# Patient Record
Sex: Male | Born: 1965 | Race: White | Hispanic: No | Marital: Married | Smoking: Light tobacco smoker
Health system: Southern US, Community
[De-identification: ages and names within clinical notes are randomized; demographics above are authoritative.]

## PROBLEM LIST (undated history)

## (undated) DIAGNOSIS — G43909 Migraine, unspecified, not intractable, without status migrainosus: Secondary | ICD-10-CM

## (undated) DIAGNOSIS — H3320 Serous retinal detachment, unspecified eye: Secondary | ICD-10-CM

## (undated) DIAGNOSIS — S0230XA Fracture of orbital floor, unspecified side, initial encounter for closed fracture: Secondary | ICD-10-CM

## (undated) DIAGNOSIS — S060X0A Concussion without loss of consciousness, initial encounter: Secondary | ICD-10-CM

## (undated) DIAGNOSIS — F0781 Postconcussional syndrome: Secondary | ICD-10-CM

## (undated) DIAGNOSIS — F411 Generalized anxiety disorder: Secondary | ICD-10-CM

## (undated) HISTORY — DX: Migraine, unspecified, not intractable, without status migrainosus: G43.909

## (undated) HISTORY — DX: Postconcussional syndrome: F07.81

## (undated) HISTORY — DX: Serous retinal detachment, unspecified eye: H33.20

## (undated) HISTORY — DX: Concussion without loss of consciousness, initial encounter: S06.0X0A

## (undated) HISTORY — PX: KNEE SURGERY: SHX244

## (undated) HISTORY — PX: BACK SURGERY: SHX140

## (undated) HISTORY — DX: Fracture of orbital floor, unspecified side, initial encounter for closed fracture: S02.30XA

## (undated) HISTORY — DX: Generalized anxiety disorder: F41.1

---

## 2004-09-12 ENCOUNTER — Emergency Department: Payer: Self-pay | Admitting: Emergency Medicine

## 2005-03-06 ENCOUNTER — Emergency Department: Payer: Self-pay | Admitting: Internal Medicine

## 2005-07-03 ENCOUNTER — Emergency Department: Payer: Self-pay | Admitting: Emergency Medicine

## 2010-08-13 ENCOUNTER — Emergency Department: Payer: Self-pay | Admitting: Unknown Physician Specialty

## 2011-09-24 ENCOUNTER — Telehealth: Payer: Self-pay | Admitting: *Deleted

## 2011-09-24 NOTE — Telephone Encounter (Signed)
I completely agree. Fever, stiff neck, malaise can be signs of bacterial meningitis. He needs to be evaluated in the ER ASAP. He may have been partially treated with zpac. Untreated meningitis can lead to loss of limb and death. I will look at schedule with Zella Ball to get him in as soon as possible as new patient, but he MUST be seen in the ED for current symptoms.

## 2011-09-24 NOTE — Telephone Encounter (Signed)
Yesterday PM I spoke with pt's wife (who is a patient of Dr Dan Humphreys) - She wanted to make new patient apt ASAP (yesterday) with Dr Dan Humphreys. I informed her that new patient apts were 2 weeks out. Wife said it was urgent that he get in with MD due to symptoms.   Pt was being treated for a sinus infection by his PCP and completed zpak 3 days ago. Wife says that the PCP keeps telling them to "give the zpak more time".  He also has developed nausea, vomiting and diarrhea with some small amounts of blood during BM's. Continues to have low grade fever, STIFF NECK, chills and body aches even with OTC fever reducers. At night pt c/o severe headache/pains and is unable to lay flat.    I stressed she take him to the ER due to these very serious symptoms that have persisted. She says he has refused to go to the ER. Patient almost passed out a day or so ago while driving. Again I stressed she take him to the ER. She understands but then asked if she should call [facility] b/c "he was a patient there many years ago". I AGAIN stressed to her he has serious symptoms and should be evaluated asap at the ER.  I advised wife that we would see him as a new patient when there was an opening but he must go to the ER ASAP, she voiced understanding.

## 2011-09-24 NOTE — Telephone Encounter (Signed)
Left message for pt wife to call office that we set up new patient appointment also left message that dr walker said her husband needed to go to the er

## 2012-03-08 ENCOUNTER — Emergency Department: Payer: Self-pay | Admitting: Emergency Medicine

## 2012-03-08 LAB — CBC
HCT: 44.7 % (ref 40.0–52.0)
HGB: 15.3 g/dL (ref 13.0–18.0)
MCH: 34.2 pg — ABNORMAL HIGH (ref 26.0–34.0)
MCHC: 34.1 g/dL (ref 32.0–36.0)
RBC: 4.46 10*6/uL (ref 4.40–5.90)

## 2012-03-08 LAB — COMPREHENSIVE METABOLIC PANEL
Alkaline Phosphatase: 96 U/L (ref 50–136)
Anion Gap: 8 (ref 7–16)
Bilirubin,Total: 0.7 mg/dL (ref 0.2–1.0)
Chloride: 107 mmol/L (ref 98–107)
Creatinine: 1.02 mg/dL (ref 0.60–1.30)
EGFR (African American): >60
Glucose: 79 mg/dL (ref 65–99)
Osmolality: 278 (ref 275–301)
SGOT(AST): 27 U/L (ref 15–37)
SGPT (ALT): 37 U/L
Sodium: 140 mmol/L (ref 136–145)

## 2012-03-08 LAB — URINALYSIS, COMPLETE
Glucose,UR: NEGATIVE mg/dL (ref 0–75)
Hyaline Cast: 1
Ketone: NEGATIVE
Ph: 5 (ref 4.5–8.0)
Protein: NEGATIVE
Specific Gravity: 1.014 (ref 1.003–1.030)
WBC UR: 2 /HPF (ref 0–5)

## 2012-12-15 ENCOUNTER — Emergency Department (HOSPITAL_COMMUNITY)
Admission: EM | Admit: 2012-12-15 | Discharge: 2012-12-15 | Disposition: A | Discharge status: Home / Self Care | Payer: PRIVATE HEALTH INSURANCE | Attending: Emergency Medicine | Admitting: Emergency Medicine

## 2012-12-15 ENCOUNTER — Emergency Department (HOSPITAL_COMMUNITY): Payer: Self-pay

## 2012-12-15 ENCOUNTER — Encounter (HOSPITAL_COMMUNITY): Payer: Self-pay | Admitting: Emergency Medicine

## 2012-12-15 DIAGNOSIS — J40 Bronchitis, not specified as acute or chronic: Secondary | ICD-10-CM | POA: Insufficient documentation

## 2012-12-15 DIAGNOSIS — F17200 Nicotine dependence, unspecified, uncomplicated: Secondary | ICD-10-CM | POA: Insufficient documentation

## 2012-12-15 DIAGNOSIS — R059 Cough, unspecified: Secondary | ICD-10-CM | POA: Insufficient documentation

## 2012-12-15 DIAGNOSIS — R05 Cough: Secondary | ICD-10-CM | POA: Insufficient documentation

## 2012-12-15 MED ORDER — HYDROCOD POLST-CHLORPHEN POLST 10-8 MG/5ML PO LQCR: 5.0000 mL | Freq: Two times a day (BID) | ORAL | Status: DC | PRN

## 2012-12-15 MED ORDER — DEXAMETHASONE SODIUM PHOSPHATE 10 MG/ML IJ SOLN
10.0000 mg | Freq: Once | INTRAMUSCULAR | Status: AC
  Administered 2012-12-15: 10 mg via INTRAMUSCULAR
  Filled 2012-12-15: qty 1

## 2012-12-15 MED ORDER — ALBUTEROL SULFATE HFA 108 (90 BASE) MCG/ACT IN AERS
2.0000 | INHALATION_SPRAY | RESPIRATORY_TRACT | Status: DC | PRN
  Administered 2012-12-15: 2 via RESPIRATORY_TRACT
  Filled 2012-12-15: qty 6.7

## 2012-12-15 NOTE — ED Notes (Signed)
MD at bedside. 

## 2012-12-15 NOTE — ED Provider Notes (Signed)
History     CSN: [id number]  Arrival date & time 12/15/12  1359   First MD Initiated Contact with Patient 12/15/12 1449      Chief Complaint  Patient presents with  . Shortness of Breath  . URI  . Cough    (Consider location/radiation/quality/duration/timing/severity/associated sxs/prior treatment) HPI Pt with no known medical problems reports 2-3 days of cough, wheezing, SOB, sore throat, fever and general malaise. He has had other co-workers who have had similar symptoms. He is a smoker.   History reviewed. No pertinent past medical history.  History reviewed. No pertinent past surgical history.  History reviewed. No pertinent family history.  History  Substance Use Topics  . Smoking status: Current Every Day Smoker  . Smokeless tobacco: Not on file  . Alcohol Use: No      Review of Systems All other systems reviewed and are negative except as noted in HPI.   Allergies  Review of patient's allergies indicates no known allergies.  Home Medications  No current outpatient prescriptions on file.  BP 125/74  Pulse 95  Temp 97.9 F (36.6 C) (Oral)  Resp 16  SpO2 97%  Physical Exam  Nursing note and vitals reviewed. Constitutional: He is oriented to person, place, and time. He appears well-developed and well-nourished.  HENT:  Head: Normocephalic and atraumatic.  Eyes: EOM are normal. Pupils are equal, round, and reactive to light.  Neck: Normal range of motion. Neck supple.  Cardiovascular: Normal rate, normal heart sounds and intact distal pulses.   Pulmonary/Chest: Effort normal. He has wheezes.  Abdominal: Bowel sounds are normal. He exhibits no distension. There is no tenderness.  Musculoskeletal: Normal range of motion. He exhibits no edema and no tenderness.  Neurological: He is alert and oriented to person, place, and time. He has normal strength. No cranial nerve deficit or sensory deficit.  Skin: Skin is warm and dry. No rash noted.  Psychiatric:  He has a normal mood and affect.    ED Course  Procedures (including critical care time)   Labs Reviewed  RAPID STREP SCREEN   Dg Chest 2 View  12/15/2012  *RADIOLOGY REPORT*  Clinical Data: Wheezing and shortness of breath.  Chest pain.  CHEST - 2 VIEW  Comparison: None.  Findings: The heart size is normal.  Mild interstitial coarsening is likely chronic.  The lung volumes are low.  No focal airspace disease is evident.  IMPRESSION:  1.  Low lung volumes. 2.  No acute cardiopulmonary disease.   Original Report Authenticated By: Marin Roberts, M.D.      No diagnosis found.    MDM  Strep and CXR ordered in triage negative. Likely a viral bronchitis. Given albuterol, decadron. Rx for Tussionex.         Charles B. Bernette Mayers, MD 12/15/12 [phone]

## 2012-12-15 NOTE — ED Notes (Addendum)
Pt c/o sob since last night. Pt states "ive been sick since Monday." Pt states "My neck and back are really sore." Pt coughing up yellow mucus. Pt breathing loudly through mouth. No resp distress noted.

## 2012-12-15 NOTE — ED Notes (Signed)
Pt c/o sore throat, SOB, cough and URI x 3 days; pt sts increased SOB

## 2013-02-08 ENCOUNTER — Emergency Department: Payer: Self-pay | Admitting: Emergency Medicine

## 2013-02-08 LAB — COMPREHENSIVE METABOLIC PANEL
Albumin: 3.7 g/dL (ref 3.4–5.0)
Alkaline Phosphatase: 116 U/L (ref 50–136)
Anion Gap: 7 (ref 7–16)
Bilirubin,Total: 0.8 mg/dL (ref 0.2–1.0)
Calcium, Total: 8.4 mg/dL — ABNORMAL LOW (ref 8.5–10.1)
Co2: 24 mmol/L (ref 21–32)
EGFR (African American): >60
EGFR (Non-African Amer.): >60
Glucose: 106 mg/dL — ABNORMAL HIGH (ref 65–99)
Potassium: 4 mmol/L (ref 3.5–5.1)
SGOT(AST): 21 U/L (ref 15–37)
Sodium: 136 mmol/L (ref 136–145)
Total Protein: 7.3 g/dL (ref 6.4–8.2)

## 2013-02-08 LAB — CBC
HCT: 49.7 % (ref 40.0–52.0)
HGB: 17 g/dL (ref 13.0–18.0)
MCH: 33.6 pg (ref 26.0–34.0)
MCV: 98 fL (ref 80–100)
Platelet: 191 10*3/uL (ref 150–440)
RBC: 5.07 10*6/uL (ref 4.40–5.90)
RDW: 13.2 % (ref 11.5–14.5)

## 2013-02-08 LAB — URINALYSIS, COMPLETE
Blood: NEGATIVE
Glucose,UR: NEGATIVE mg/dL (ref 0–75)
Ketone: NEGATIVE
Leukocyte Esterase: NEGATIVE
Nitrite: NEGATIVE
Squamous Epithelial: NONE SEEN

## 2013-02-08 LAB — RAPID INFLUENZA A&B ANTIGENS

## 2013-02-08 LAB — TROPONIN I: Troponin-I: <0.02 ng/mL

## 2013-06-07 ENCOUNTER — Ambulatory Visit: Payer: PRIVATE HEALTH INSURANCE | Admitting: Family Medicine

## 2013-07-11 ENCOUNTER — Emergency Department (HOSPITAL_COMMUNITY): Payer: PRIVATE HEALTH INSURANCE

## 2013-07-11 ENCOUNTER — Emergency Department (HOSPITAL_COMMUNITY)
Admission: EM | Admit: 2013-07-11 | Discharge: 2013-07-11 | Disposition: A | Discharge status: Home / Self Care | Payer: PRIVATE HEALTH INSURANCE | Attending: Emergency Medicine | Admitting: Emergency Medicine

## 2013-07-11 ENCOUNTER — Encounter (HOSPITAL_COMMUNITY): Payer: Self-pay | Admitting: Emergency Medicine

## 2013-07-11 DIAGNOSIS — S39012A Strain of muscle, fascia and tendon of lower back, initial encounter: Secondary | ICD-10-CM

## 2013-07-11 DIAGNOSIS — W1809XA Striking against other object with subsequent fall, initial encounter: Secondary | ICD-10-CM | POA: Insufficient documentation

## 2013-07-11 DIAGNOSIS — Y9389 Activity, other specified: Secondary | ICD-10-CM | POA: Insufficient documentation

## 2013-07-11 DIAGNOSIS — F17200 Nicotine dependence, unspecified, uncomplicated: Secondary | ICD-10-CM | POA: Insufficient documentation

## 2013-07-11 DIAGNOSIS — S335XXA Sprain of ligaments of lumbar spine, initial encounter: Secondary | ICD-10-CM | POA: Insufficient documentation

## 2013-07-11 DIAGNOSIS — Y92009 Unspecified place in unspecified non-institutional (private) residence as the place of occurrence of the external cause: Secondary | ICD-10-CM | POA: Insufficient documentation

## 2013-07-11 DIAGNOSIS — R209 Unspecified disturbances of skin sensation: Secondary | ICD-10-CM | POA: Insufficient documentation

## 2013-07-11 DIAGNOSIS — S3992XA Unspecified injury of lower back, initial encounter: Secondary | ICD-10-CM

## 2013-07-11 DIAGNOSIS — R61 Generalized hyperhidrosis: Secondary | ICD-10-CM | POA: Insufficient documentation

## 2013-07-11 MED ORDER — OXYCODONE-ACETAMINOPHEN 5-325 MG PO TABS
1.0000 | ORAL_TABLET | Freq: Once | ORAL | Status: AC
  Administered 2013-07-11: 1 via ORAL
  Filled 2013-07-11: qty 1

## 2013-07-11 MED ORDER — KETOROLAC TROMETHAMINE 60 MG/2ML IM SOLN
60.0000 mg | Freq: Once | INTRAMUSCULAR | Status: AC
  Administered 2013-07-11: 60 mg via INTRAMUSCULAR
  Filled 2013-07-11: qty 2

## 2013-07-11 MED ORDER — NAPROXEN 500 MG PO TABS: 500.0000 mg | ORAL_TABLET | Freq: Two times a day (BID) | ORAL | Status: DC

## 2013-07-11 MED ORDER — DIAZEPAM 5 MG PO TABS: 5.0000 mg | ORAL_TABLET | Freq: Three times a day (TID) | ORAL | Status: DC | PRN

## 2013-07-11 MED ORDER — DIAZEPAM 5 MG PO TABS
5.0000 mg | ORAL_TABLET | Freq: Once | ORAL | Status: AC
  Administered 2013-07-11: 5 mg via ORAL
  Filled 2013-07-11: qty 1

## 2013-07-11 MED ORDER — HYDROMORPHONE HCL PF 1 MG/ML IJ SOLN
1.0000 mg | Freq: Once | INTRAMUSCULAR | Status: AC
  Administered 2013-07-11: 1 mg via INTRAMUSCULAR
  Filled 2013-07-11: qty 1

## 2013-07-11 MED ORDER — OXYCODONE-ACETAMINOPHEN 5-325 MG PO TABS: 1.0000 | ORAL_TABLET | ORAL | Status: DC | PRN

## 2013-07-11 NOTE — ED Provider Notes (Signed)
CSN: [id number]     Arrival date & time 07/11/13  1443 History    This chart was scribed for a non-physician practitioner, Jaynie Crumble, PA-C, working with Claudean Kinds, MD by Frederik Pear, ED Scribe. This patient was seen in room TR10C/TR10C and the patient's care was started at 1519.   First MD Initiated Contact with Patient 07/11/13 1519     Chief Complaint  Patient presents with  . Back Pain   (Consider location/radiation/quality/duration/timing/severity/associated sxs/prior Treatment) The history is provided by the patient. No language interpreter was used.    HPI Comments: Miguel Jimenez is a 47 y.o. male who presents to the Emergency Department complaining of waxing and waning back pain that is aggravated with extension of his bilateral legs with associated intermittent diaphoresis that began 2 days ago when he fell off the back of the wooden deck at his home and landed on his back.  He reports his back was stiff yesterday, but the pain significantly worsened last night. He states the pain initially radiated down his bilateral posterior legs, but reports the radiation has resolved on the right, but the pain persists down the left leg with associated tingling in his left toes. He denies urinary or fecal incontinence, fever, and abdominal pain. He has been treating the pain with ibuprofen with the last dose being earlier today with no relief.   He reports he also applied a heating pad to the area last night without relief, but denies applying ice. No PCP.   History reviewed. No pertinent past medical history. History reviewed. No pertinent past surgical history. No family history on file. History  Substance Use Topics  . Smoking status: Current Every Day Smoker  . Smokeless tobacco: Not on file  . Alcohol Use: No    Review of Systems  Constitutional: Positive for diaphoresis. Negative for fever.  Gastrointestinal: Negative for abdominal pain.  Genitourinary:       No  urinary or fecal incontinence.   Musculoskeletal: Positive for back pain.  Skin: Negative for wound.  Neurological: Negative for numbness.       Tingling.     Allergies  Review of patient's allergies indicates no known allergies.  Home Medications   Current Outpatient Rx  Name  Route  Sig  Dispense  Refill  . ibuprofen (ADVIL,MOTRIN) 200 MG tablet   Oral   Take 800 mg by mouth every 6 (six) hours as needed for pain.          BP 129/91  Pulse 84  Temp(Src) 97.8 F (36.6 C) (Oral)  Resp 17  SpO2 96% Physical Exam  Nursing note and vitals reviewed. Constitutional: He is oriented to person, place, and time. He appears well-developed and well-nourished. No distress.  HENT:  Head: Normocephalic and atraumatic.  Eyes: EOM are normal.  Neck: Neck supple. No tracheal deviation present.  Cardiovascular: Normal rate.   Pulmonary/Chest: Effort normal. No respiratory distress.  Abdominal: Soft. Bowel sounds are normal. There is no tenderness. There is no guarding.  Musculoskeletal: Normal range of motion. He exhibits tenderness.  Tender in lower thoracic midline spine and para-veretbral msucle tenderness. No bruising or swelling. Pain with lateral straight leg raise. No tenderness over pelvis. Normal hips.    Neurological: He is alert and oriented to person, place, and time. He has normal strength. He displays normal reflexes. No sensory deficit.  2+ patellar reflexes bilaterally. 5/5 equal and bilateral lower extremity strength. Normal sensation in distal feet, toes, plantar and dorsal surface.  Normal sensation over bilateral thighs and perineum  Skin: Skin is warm and dry.  Psychiatric: He has a normal mood and affect. His behavior is normal.    ED Course   Procedures (including critical care time)  DIAGNOSTIC STUDIES: Oxygen Saturation is 96% on room air, adequate by my interpretation.    COORDINATION OF CARE:  15:50- Discussed planned course of treatment with the  patient, including lumbar and thoracic spine X-rays, Toradol, Valium, and Percocet, who is agreeable at this time.  17:20- Consult with supervising physician, Claudean Kinds, MD, who advised the pt to follow up with orthopedics and discharge him with Percocet. Discussed the negative X-ray findings and the plan for discharge with the pt, who is agreeable at this time.   Labs Reviewed - No data to display Dg Thoracic Spine 2 View  07/11/2013   *RADIOLOGY REPORT*  Clinical Data: Back pain.  Upper and lower back pain.  Fall.  THORACIC SPINE - 2 VIEW  Comparison: Chest radiograph 12/15/2012.  Findings: Thoracic spine exam is technically degraded.  There is mild dextroconvex curve of the upper thoracic spine which may be positional.  Vertebral body height appears preserved on the frontal views.  Paraspinal lines appear within normal limits.  There is no fracture identified.  Swimmer's view is technically suboptimal.  IMPRESSION: No gross acute abnormality.   Original Report Authenticated By: Andreas Newport, M.D.   Dg Lumbar Spine Complete  07/11/2013   *RADIOLOGY REPORT*  Clinical Data: Back pain.Fall.  LUMBAR SPINE - COMPLETE 4+ VIEW  Comparison: None.  Findings: Five lumbar type vertebral bodies are present.  Vertebral body height appears preserved.  There are no pars defects.  The alignment is anatomic.  Mild degenerative disc disease is present at L3-L4, L4-L5 and L5-S1.  Transverse processes appear within normal limits.  IMPRESSION: Mild lumbar spondylosis.  No acute osseous abnormality.   Original Report Authenticated By: Andreas Newport, M.D.   1. Lumbar strain, initial encounter   2. Lumbosacral injury, initial encounter     MDM  Pt with lower back pain after a fall 2 days ago. Neurovascularly intact. No red flags at this time to suggest cauda equina or major disk injury. Pt's VS are normal. Abdomen non tender. Feeling better with toradol, valium, percocet and dilaudid IM. Discussed with Dr.  Fayrene Fearing who has seen pt as well. Plan to d/c home with trial of pain medications, muscle relaxants, rest, NSAIDs, and close follow up. Precautions given to return.   Filed Vitals:   07/11/13 1447 07/11/13 1734  BP: 129/91 109/75  Pulse: 84 68  Temp: 97.8 F (36.6 C)   TempSrc: Oral   Resp: 17 16  SpO2: 96% 97%   I personally performed the services described in this documentation, which was scribed in my presence. The recorded information has been reviewed and is accurate.    Lottie Mussel, PA-C 07/11/13 1805

## 2013-07-11 NOTE — ED Notes (Signed)
Pt. Srtated, I fell off my deck on Sat and hurt my lower back.

## 2013-07-15 NOTE — ED Provider Notes (Signed)
Medical screening examination/treatment/procedure(s) were performed by non-physician practitioner and as supervising physician I was immediately available for consultation/collaboration.   Claudean Kinds, MD 07/15/13 1050

## 2013-08-03 ENCOUNTER — Ambulatory Visit (INDEPENDENT_AMBULATORY_CARE_PROVIDER_SITE_OTHER): Payer: PRIVATE HEALTH INSURANCE | Admitting: Family Medicine

## 2013-08-03 ENCOUNTER — Encounter: Payer: Self-pay | Admitting: Family Medicine

## 2013-08-03 VITALS — BP 110/82 | HR 72 | Temp 98.4°F | Ht 71.54 in | Wt 202.5 lb

## 2013-08-03 DIAGNOSIS — R5381 Other malaise: Secondary | ICD-10-CM

## 2013-08-03 DIAGNOSIS — Z131 Encounter for screening for diabetes mellitus: Secondary | ICD-10-CM

## 2013-08-03 DIAGNOSIS — E0590 Thyrotoxicosis, unspecified without thyrotoxic crisis or storm: Secondary | ICD-10-CM

## 2013-08-03 DIAGNOSIS — Z0000 Encounter for general adult medical examination without abnormal findings: Secondary | ICD-10-CM

## 2013-08-03 DIAGNOSIS — Z13220 Encounter for screening for lipoid disorders: Secondary | ICD-10-CM

## 2013-08-03 DIAGNOSIS — G43909 Migraine, unspecified, not intractable, without status migrainosus: Secondary | ICD-10-CM

## 2013-08-03 DIAGNOSIS — F411 Generalized anxiety disorder: Secondary | ICD-10-CM

## 2013-08-03 LAB — LIPID PANEL
Cholesterol: 200 mg/dL (ref 0–200)
HDL: 41.9 mg/dL (ref >39.00)
Triglycerides: 248 mg/dL — ABNORMAL HIGH (ref 0.0–149.0)
VLDL: 49.6 mg/dL — ABNORMAL HIGH (ref 0.0–40.0)

## 2013-08-03 LAB — CBC WITH DIFFERENTIAL/PLATELET
Basophils Absolute: 0 10*3/uL (ref 0.0–0.1)
Eosinophils Absolute: 0.5 10*3/uL (ref 0.0–0.7)
HCT: 44.7 % (ref 39.0–52.0)
Hemoglobin: 15.3 g/dL (ref 13.0–17.0)
Lymphs Abs: 2 10*3/uL (ref 0.7–4.0)
MCHC: 34.2 g/dL (ref 30.0–36.0)
Neutro Abs: 4.3 10*3/uL (ref 1.4–7.7)
Platelets: 239 10*3/uL (ref 150.0–400.0)
RDW: 13.3 % (ref 11.5–14.6)

## 2013-08-03 LAB — BASIC METABOLIC PANEL
BUN: 14 mg/dL (ref 6–23)
CO2: 29 mEq/L (ref 19–32)
Calcium: 9.2 mg/dL (ref 8.4–10.5)
Glucose, Bld: 86 mg/dL (ref 70–99)
Potassium: 4 mEq/L (ref 3.5–5.1)
Sodium: 138 mEq/L (ref 135–145)

## 2013-08-03 LAB — HEPATIC FUNCTION PANEL: Albumin: 4.1 g/dL (ref 3.5–5.2)

## 2013-08-03 MED ORDER — ALPRAZOLAM 0.25 MG PO TABS: 0.2500 mg | ORAL_TABLET | Freq: Three times a day (TID) | ORAL | Status: DC | PRN

## 2013-08-03 MED ORDER — ESCITALOPRAM OXALATE 10 MG PO TABS: 10.0000 mg | ORAL_TABLET | Freq: Every day | ORAL | Status: DC

## 2013-08-03 NOTE — Patient Instructions (Addendum)
Insomnia:  Melatonin 3-5 mg can be taken 1 hour before sleep very safely every day  Antihistamines: 2 tabs of Benadryl is OK Can also take 2 Dramamine (get the older version that can cause drowsiness) Or Unisom (doxylamine)

## 2013-08-03 NOTE — Progress Notes (Signed)
Nature conservation officer at [facility] [address] [place] [address] Phone: [phone] Fax: [phone]  Date:  08/03/2013   Name:  Miguel Jimenez   DOB:  17-Feb-1966   MRN:  [id number] Gender: male Age: 47 y.o.  Primary Physician:  Hannah Beat, MD  Evaluating MD: Hannah Beat, MD   Chief Complaint: Establish Care   History of Present Illness:  Miguel Jimenez is a 47 y.o. pleasant patient who presents with the following:  CPX:  Had tried some wellbutrin, had taken some xanax  Rectal is a white male, 63 here for a health maintenance exam and to patient evaluation. He generally has minimal physical complaints. He does not Pack a day. It was started this after he quit dipping.  Primary complaint is some stress and anxiety and irritability. No suicidality or homicidality. 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 This is very stressful. He has 2 children at home and is married happily.  Preventative Health Maintenance Visit:  Health Maintenance Summary Reviewed and updated, unless pt declines services.  Tobacco History Reviewed. Longtime dip. 1 year of smoking. Alcohol: No concerns, no excessive use Exercise Habits: sometimes regular, but intermittently not. STD concerns: no risk or activity to increase risk Drug Use: None Encouraged self-testicular check  Labs reviewed post ov.  Results for orders placed in visit on 08/03/13  BASIC METABOLIC PANEL      Result Value Range   Sodium 138  135 - 145 mEq/L   Potassium 4.0  3.5 - 5.1 mEq/L   Chloride 105  96 - 112 mEq/L   CO2 29  19 - 32 mEq/L   Glucose, Bld 86  70 - 99 mg/dL   BUN 14  6 - 23 mg/dL   Creatinine, Ser 1.2  0.4 - 1.5 mg/dL   Calcium 9.2  8.4 - 59.5 mg/dL   GFR 63.87  >56.43 mL/min  CBC WITH DIFFERENTIAL      Result Value Range   WBC 7.5  4.5 - 10.5 K/uL   RBC 4.59  4.22 - 5.81 Mil/uL   Hemoglobin 15.3  13.0 - 17.0 g/dL   HCT 32.9  51.8 - 84.1 %   MCV 97.4  78.0 -  100.0 fl   MCHC 34.2  30.0 - 36.0 g/dL   RDW 66.0  63.0 - 16.0 %   Platelets 239.0  150.0 - 400.0 K/uL   Neutrophils Relative % 57.7  43.0 - 77.0 %   Lymphocytes Relative 27.2  12.0 - 46.0 %   Monocytes Relative 8.1  3.0 - 12.0 %   Eosinophils Relative 6.4 (*) 0.0 - 5.0 %   Basophils Relative 0.6  0.0 - 3.0 %   Neutro Abs 4.3  1.4 - 7.7 K/uL   Lymphs Abs 2.0  0.7 - 4.0 K/uL   Monocytes Absolute 0.6  0.1 - 1.0 K/uL   Eosinophils Absolute 0.5  0.0 - 0.7 K/uL   Basophils Absolute 0.0  0.0 - 0.1 K/uL  HEPATIC FUNCTION PANEL      Result Value Range   Total Bilirubin 0.4  0.3 - 1.2 mg/dL   Bilirubin, Direct 0.1  0.0 - 0.3 mg/dL   Alkaline Phosphatase 79  39 - 117 U/L   AST 14  0 - 37 U/L   ALT 23  0 - 53 U/L   Total Protein 6.9  6.0 - 8.3 g/dL   Albumin 4.1  3.5 - 5.2 g/dL  LIPID PANEL      Result Value Range   Cholesterol 200  0 - 200 mg/dL   Triglycerides 161.0 (*) 0.0 - 149.0 mg/dL   HDL 96.04  >54.09 mg/dL   VLDL 81.1 (*) 0.0 - 91.4 mg/dL   Total CHOL/HDL Ratio 5    TSH      Result Value Range   TSH 0.18 (*) 0.35 - 5.50 uIU/mL  LDL CHOLESTEROL, DIRECT      Result Value Range   Direct LDL 132.0      Patient Active Problem List   Diagnosis Date Noted  . Hyperthyroidism 08/05/2013  . Generalized anxiety disorder 08/05/2013  . Migraine     Past Medical History  Diagnosis Date  . Migraine   . Generalized anxiety disorder 08/05/2013    Past Surgical History  Procedure Laterality Date  . Knee surgery      History   Social History  . Marital Status: Married    Spouse Name: Junius Roads    Number of Children: 2  . Years of Education: 16   Occupational History  . Nature conservation officer   Social History Main Topics  . Smoking status: Current Every Day Smoker -- 0.50 packs/day    Types: Cigarettes  . Smokeless tobacco: Former Neurosurgeon    Types: Snuff  . Alcohol Use: 0.5 oz/week    1 drink(s) per week     Comment: occasional  . Drug Use: No  . Sexual Activity: Yes     Partners: Female   Other Topics Concern  . Not on file   Social History Narrative   Physiological scientist   Married to former [employer]   2 children, 6 and 15    No family history on file.  No Known Allergies  Current Outpatient Prescriptions on File Prior to Visit  Medication Sig Dispense Refill  . ibuprofen (ADVIL,MOTRIN) 200 MG tablet Take 800 mg by mouth every 6 (six) hours as needed for pain.      . naproxen (NAPROSYN) 500 MG tablet Take 1 tablet (500 mg total) by mouth 2 (two) times daily.  30 tablet  0   No current facility-administered medications on file prior to visit.     Review of Systems:   General: Denies fever, chills, sweats. No significant weight loss. Eyes: Denies blurring,significant itching ENT: Denies earache, sore throat, and hoarseness. Cardiovascular: Denies chest pains, palpitations, dyspnea on exertion Respiratory: Denies cough, dyspnea at rest,wheeezing Breast: no concerns about lumps GI: Denies nausea, vomiting, diarrhea, constipation, change in bowel habits, abdominal pain, melena, hematochezia GU: Denies penile discharge, some occ ED, no urinary flow / outflow problems. No STD concerns. Musculoskeletal: Denies back pain, joint pain Derm: Denies rash, itching Neuro: Denies  paresthesias, frequent falls, frequent headaches Psych: Denies depression, anxiety Endocrine: Denies cold intolerance, heat intolerance, polydipsia Heme: Denies enlarged lymph nodes Allergy: No hayfever   Physical Examination: BP 110/82  Pulse 72  Temp(Src) 98.4 F (36.9 C) (Oral)  Ht 5' 11.54" (1.817 m)  Wt 202 lb 8 oz (91.853 kg)  BMI 27.82 kg/m2  Ideal Body Weight: Weight in (lb) to have BMI = 25: 181.6  GEN: well developed, well nourished, no acute distress Eyes: conjunctiva and lids normal, PERRLA, EOMI ENT: TM clear, nares clear, oral exam WNL Neck: supple, no lymphadenopathy, no thyromegaly, no JVD Pulm: clear to auscultation and percussion, respiratory  effort normal CV: regular rate and rhythm, S1-S2, no murmur, rub or gallop, no bruits, peripheral pulses  normal and symmetric, no cyanosis, clubbing, edema or varicosities GI: soft, non-tender; no hepatosplenomegaly, masses; active bowel sounds all quadrants GU: no hernia, testicular mass, penile discharge Lymph: no cervical, axillary or inguinal adenopathy MSK: gait normal, muscle tone and strength WNL, no joint swelling, effusions, discoloration, crepitus  SKIN: clear, good turgor, color WNL, no rashes, lesions, or ulcerations Neuro: normal mental status, normal strength, sensation, and motion Psych: alert; oriented to person, place and time, normally interactive and not anxious or depressed in appearance.   Assessment and Plan:  Routine general medical examination at a health care facility - Plan: TSH  Screening for lipoid disorders - Plan: Lipid panel  Screening for diabetes mellitus - Plan: Basic metabolic panel  Other malaise and fatigue - Plan: CBC with Differential, Hepatic function panel, TSH  Hyperthyroidism  The patient's preventative maintenance and recommended screening tests for an annual wellness exam were reviewed in full today. Brought up to date unless services declined.  Counselled on the importance of diet, exercise, and its role in overall health and mortality. The patient's FH and SH was reviewed, including their home life, tobacco status, and drug and alcohol status.   Biggesst issue now is working 70+ hours a week and stress and anxiety. No si or HI. Start lexapro, prn xanax and recheck in 6 weeks.  TSH low on labs also, will need to f/u and get TSH, Free T3 and T4.  Orders Today:  Orders Placed This Encounter  Procedures  . Basic metabolic panel  . CBC with Differential  . Hepatic function panel  . Lipid panel  . TSH  . LDL cholesterol, direct    Updated Medication List: (Includes new medications, updates to list, dose adjustments) Meds ordered  this encounter  Medications  . Melatonin 10 MG CAPS    Sig: Take 1 capsule by mouth at bedtime as needed.  . ALPRAZolam (XANAX) 0.25 MG tablet    Sig: Take 1 tablet (0.25 mg total) by mouth 3 (three) times daily as needed for anxiety.    Dispense:  30 tablet    Refill:  2  . escitalopram (LEXAPRO) 10 MG tablet    Sig: Take 1 tablet (10 mg total) by mouth daily.    Dispense:  30 tablet    Refill:  2    Medications Discontinued: Medications Discontinued During This Encounter  Medication Reason  . oxyCODONE-acetaminophen (PERCOCET) 5-325 MG per tablet Error  . diazepam (VALIUM) 5 MG tablet Error      Signed, Madysen Faircloth T. Elvie Maines, MD 08/03/2013 2:13 PM

## 2013-08-05 ENCOUNTER — Encounter: Payer: Self-pay | Admitting: Family Medicine

## 2013-08-05 DIAGNOSIS — G43909 Migraine, unspecified, not intractable, without status migrainosus: Secondary | ICD-10-CM | POA: Insufficient documentation

## 2013-08-05 DIAGNOSIS — E0590 Thyrotoxicosis, unspecified without thyrotoxic crisis or storm: Secondary | ICD-10-CM | POA: Insufficient documentation

## 2013-08-05 DIAGNOSIS — F411 Generalized anxiety disorder: Secondary | ICD-10-CM

## 2013-08-05 HISTORY — DX: Generalized anxiety disorder: F41.1

## 2013-08-09 MED ORDER — SILDENAFIL CITRATE 100 MG PO TABS: 50.0000 mg | ORAL_TABLET | Freq: Every day | ORAL | Status: DC | PRN

## 2013-08-09 NOTE — Addendum Note (Signed)
Addended by: Damita Lack on: 08/09/2013 03:40 PM   Modules accepted: Orders

## 2013-08-29 ENCOUNTER — Other Ambulatory Visit (INDEPENDENT_AMBULATORY_CARE_PROVIDER_SITE_OTHER): Payer: PRIVATE HEALTH INSURANCE

## 2013-08-29 ENCOUNTER — Encounter: Payer: Self-pay | Admitting: *Deleted

## 2013-08-29 DIAGNOSIS — E0590 Thyrotoxicosis, unspecified without thyrotoxic crisis or storm: Secondary | ICD-10-CM

## 2013-08-30 ENCOUNTER — Encounter: Payer: Self-pay | Admitting: *Deleted

## 2013-08-30 ENCOUNTER — Ambulatory Visit (INDEPENDENT_AMBULATORY_CARE_PROVIDER_SITE_OTHER): Payer: PRIVATE HEALTH INSURANCE | Admitting: Internal Medicine

## 2013-08-30 ENCOUNTER — Encounter: Payer: Self-pay | Admitting: Internal Medicine

## 2013-08-30 ENCOUNTER — Ambulatory Visit: Payer: PRIVATE HEALTH INSURANCE | Admitting: Internal Medicine

## 2013-08-30 VITALS — BP 144/110 | HR 96 | Temp 98.9°F

## 2013-08-30 DIAGNOSIS — Y92009 Unspecified place in unspecified non-institutional (private) residence as the place of occurrence of the external cause: Secondary | ICD-10-CM

## 2013-08-30 DIAGNOSIS — M5380 Other specified dorsopathies, site unspecified: Secondary | ICD-10-CM

## 2013-08-30 DIAGNOSIS — M545 Low back pain: Secondary | ICD-10-CM

## 2013-08-30 DIAGNOSIS — W19XXXA Unspecified fall, initial encounter: Secondary | ICD-10-CM

## 2013-08-30 DIAGNOSIS — M25559 Pain in unspecified hip: Secondary | ICD-10-CM

## 2013-08-30 DIAGNOSIS — M62830 Muscle spasm of back: Secondary | ICD-10-CM

## 2013-08-30 DIAGNOSIS — M25552 Pain in left hip: Secondary | ICD-10-CM

## 2013-08-30 LAB — TSH: TSH: 1.32 u[IU]/mL (ref 0.35–5.50)

## 2013-08-30 MED ORDER — KETOROLAC TROMETHAMINE 30 MG/ML IJ SOLN
30.0000 mg | Freq: Once | INTRAMUSCULAR | Status: AC
  Administered 2013-08-30: 30 mg via INTRAMUSCULAR

## 2013-08-30 MED ORDER — CYCLOBENZAPRINE HCL 10 MG PO TABS: 10.0000 mg | ORAL_TABLET | Freq: Three times a day (TID) | ORAL | Status: DC | PRN

## 2013-08-30 MED ORDER — METHYLPREDNISOLONE ACETATE 80 MG/ML IJ SUSP
80.0000 mg | Freq: Once | INTRAMUSCULAR | Status: AC
  Administered 2013-08-30: 80 mg via INTRAMUSCULAR

## 2013-08-30 MED ORDER — TRAMADOL HCL 50 MG PO TABS: 100.0000 mg | ORAL_TABLET | Freq: Four times a day (QID) | ORAL | Status: DC | PRN

## 2013-08-30 MED ORDER — HYDROCODONE-ACETAMINOPHEN 5-325 MG PO TABS: 1.0000 | ORAL_TABLET | Freq: Four times a day (QID) | ORAL | Status: DC | PRN

## 2013-08-30 NOTE — Addendum Note (Signed)
Addended by: Cindee Lame R on: 08/30/2013 03:21 PM   Modules accepted: Orders

## 2013-08-30 NOTE — Addendum Note (Signed)
Addended by: Lorre Munroe on: 08/30/2013 03:04 PM   Modules accepted: Orders, Medications

## 2013-08-30 NOTE — Progress Notes (Addendum)
Subjective:    Patient ID: Miguel Jimenez, male    DOB: Jul 15, 1966, 47 y.o.   MRN: [id number]  HPI  Pt presents to the clinic today with c/o left hip/back pain. This started last night. He was spraying his deck when he slipped on the water. He fell and hit his hip/back on the corner of some bricks. He has tried ibuprofen which has has not helped. He has trouble standing, sitting or laying down. The pain has kept him up all last night. He denies radicular symptoms in his legs. He denies loss of bowel or bladder.  Review of Systems      Past Medical History  Diagnosis Date  . Migraine   . Generalized anxiety disorder 08/05/2013    Current Outpatient Prescriptions  Medication Sig Dispense Refill  . ALPRAZolam (XANAX) 0.25 MG tablet Take 1 tablet (0.25 mg total) by mouth 3 (three) times daily as needed for anxiety.  30 tablet  2  . escitalopram (LEXAPRO) 10 MG tablet Take 1 tablet (10 mg total) by mouth daily.  30 tablet  2  . ibuprofen (ADVIL,MOTRIN) 200 MG tablet Take 800 mg by mouth every 6 (six) hours as needed for pain.      . Melatonin 10 MG CAPS Take 1 capsule by mouth at bedtime as needed.      . naproxen (NAPROSYN) 500 MG tablet Take 1 tablet (500 mg total) by mouth 2 (two) times daily.  30 tablet  0  . sildenafil (VIAGRA) 100 MG tablet Take 0.5-1 tablets (50-100 mg total) by mouth daily as needed for erectile dysfunction.  10 tablet  11   No current facility-administered medications for this visit.    No Known Allergies  History reviewed. No pertinent family history.  History   Social History  . Marital Status: Married    Spouse Name: Junius Roads    Number of Children: 2  . Years of Education: 16   Occupational History  . Nature conservation officer   Social History Main Topics  . Smoking status: Current Every Day Smoker -- 0.50 packs/day    Types: Cigarettes  . Smokeless tobacco: Former Neurosurgeon    Types: Snuff  . Alcohol Use: 0.5 oz/week    1 drink(s) per week     Comment:  occasional  . Drug Use: No  . Sexual Activity: Yes    Partners: Female   Other Topics Concern  . Not on file   Social History Narrative   Physiological scientist   Married to former [employer]   2 children, 6 and 15     Constitutional: Denies fever, malaise, fatigue, headache or abrupt weight changes.  Musculoskeletal: Pt reports back pain. Denies difficulty with gait, or joint pain and swelling.  Skin: Denies redness, rashes, lesions or ulcercations.    No other specific complaints in a complete review of systems (except as listed in HPI above).  Objective:   Physical Exam   BP 144/110  Pulse 96  Temp(Src) 98.9 F (37.2 C) (Oral)  SpO2 96% Wt Readings from Last 3 Encounters:  08/03/13 202 lb 8 oz (91.853 kg)    General: Appears his stated age, well developed, well nourished in NAD. Skin: Warm, dry and intact. No rashes, lesions or ulcerations noted.  Cardiovascular: Normal rate and rhythm. S1,S2 noted.  No murmur, rubs or gallops noted. No JVD or BLE edema. No carotid bruits noted. Pulmonary/Chest: Normal effort and positive vesicular breath sounds. No respiratory distress. No wheezes, rales  or ronchi noted.  Musculoskeletal: Decreased flexion and extension of the back. Tense muscles noted on left lower back. No signs of joint swelling. Neurological: Alert and oriented. Cranial nerves II-XII intact. Coordination normal. +DTRs bilaterally.   BMET    Component Value Date/Time   NA 138 08/03/2013 1514   K 4.0 08/03/2013 1514   CL 105 08/03/2013 1514   CO2 29 08/03/2013 1514   GLUCOSE 86 08/03/2013 1514   BUN 14 08/03/2013 1514   CREATININE 1.2 08/03/2013 1514   CALCIUM 9.2 08/03/2013 1514    Lipid Panel     Component Value Date/Time   CHOL 200 08/03/2013 1514   TRIG 248.0* 08/03/2013 1514   HDL 41.90 08/03/2013 1514   CHOLHDL 5 08/03/2013 1514   VLDL 49.6* 08/03/2013 1514    CBC    Component Value Date/Time   WBC 7.5 08/03/2013 1514   RBC 4.59 08/03/2013 1514   HGB 15.3 08/03/2013  1514   HCT 44.7 08/03/2013 1514   PLT 239.0 08/03/2013 1514   MCV 97.4 08/03/2013 1514   MCHC 34.2 08/03/2013 1514   RDW 13.3 08/03/2013 1514   LYMPHSABS 2.0 08/03/2013 1514   MONOABS 0.6 08/03/2013 1514   EOSABS 0.5 08/03/2013 1514   BASOSABS 0.0 08/03/2013 1514    Hgb A1C No results found for this basename: HGBA1C        Assessment & Plan:   Muscle spasms of left lower back secondary to fall:  80 mg Depo IM today 30 mg Toradol IM for pain eRx for Flexeril 10 mg TID eRx for Norco 5-325 (he states tramadol doesn't) Stretching exercises as indicated on handout  RTC as needed or if pain persist/worsens

## 2013-08-30 NOTE — Patient Instructions (Signed)
Muscle Cramps  Muscle cramps are due to sudden involuntary muscle contraction. This means you have no control over the tightening of a muscle (or muscles). Often there are no obvious causes. Muscle cramps may occur with overexertion. They may also occur with chilling of the muscles. An example of a muscle chilling activity is swimming. It is uncommon for cramps to be due to a serious underlying disorder. In most cases, muscle cramps improve (or leave) within minutes.  CAUSES   Some common causes are:   Injury.   Infections, especially viral.   Abnormal levels of the salts and ions in your blood (electrolytes). This could happen if you are taking water pills (diuretics).   Blood vessel disease where not enough blood is getting to the muscles (intermittent claudication).  Some uncommon causes are:   Side effects of some medicine (such as lithium).   Alcohol abuse.   Diseases where there is soreness (inflammation) of the muscular system.  HOME CARE INSTRUCTIONS    It may be helpful to massage, stretch, and relax the affected muscle.   Taking a dose of over-the-counter diphenhydramine is helpful for night leg cramps.  SEEK MEDICAL CARE IF:   Cramps are frequent and not relieved with medicine.  MAKE SURE YOU:    Understand these instructions.   Will watch your condition.   Will get help right away if you are not doing well or get worse.  Document Released: 05/09/2002 Document Revised: 02/09/2012 Document Reviewed: 11/08/2008  ExitCare Patient Information 2014 [employer].

## 2013-09-01 ENCOUNTER — Telehealth: Payer: Self-pay | Admitting: Family Medicine

## 2013-09-01 MED ORDER — HYDROCODONE-ACETAMINOPHEN 10-325 MG PO TABS: 1.0000 | ORAL_TABLET | ORAL | Status: DC | PRN

## 2013-09-01 NOTE — Telephone Encounter (Signed)
Spoke with Miguel Jimenez.  He really doesn't  want to go to the ER or come in for office visit due to cost.  Per Dr. Patsy Lager we will call in a stronger pain medicine for him to try.  If not improving in the next couple of days, I advised him he will need to come in for office visit.  Patient in agreement with plan.  Medication called to [facility].

## 2013-09-01 NOTE — Telephone Encounter (Signed)
I called patient, LMOM. I do not think he should go to ER. If needed, I can see him today. I asked him to call Lupita Leash today. I also think it is reasonable to give him some stronger pain medication.   Please let me know if he calls, and I will speak to him myself.   Hannah Beat, MD 09/01/2013, 9:36 AM

## 2013-09-01 NOTE — Telephone Encounter (Signed)
[facility] Triage Call Report Triage Record Num: [id number] Operator: Claudie Leach Patient Name: Miguel Jimenez Call Date & Time: 08/31/2013 9:09:10PM Patient Phone: [phone] PCP: Hannah Beat Patient Gender: Male PCP Fax : [phone] Patient DOB: 1966-07-21 [facility] Name[facility] Reason for Call: Caller: Ignacio/Patient; PCP: Hannah Beat (Family Practice); CB#: [phone]; Call regarding back pain. States he fell of a deck about 4 feet and landed on a rock on 08/28/13. Went to the office on 08/30/13 and 2 injections for pain. Was given a prescription for Hydrocodone 5/325 and has taken 7 since 08/30/13 and it is not helping the pain. Is having tingling above the left hip. Is alternating ice and heat to the area. Triaged per Back Symptoms guideline. To see in ED immediately due to new onset of severe disabling back pain. Care advice given. Advised to go to the ED and caller states he does not want to go to the ED. States he can not afford it. Offered to call in Ultram and caller states he has taken that in the past and it did not help his pain. States he has also been taking Motrin and advised 800 mg q8h. Instructed on the importance of going to the ED if the pain is unbearable. Protocol(s) Used: Back Symptoms Recommended Outcome per Protocol: See ED Immediately Reason for Outcome: New onset of severe disabling back pain (unable to stand upright) Care Advice: ~ Another adult should drive. ~ IMMEDIATE ACTION Write down provider's name. List or place the following in a bag for transport with the patient: current prescription and/or nonprescription medications; alternative treatments, therapies and medications; and street drugs. ~ 08/31/2013 9:29:23PM Page 1 of 1 [email or web address]

## 2013-09-01 NOTE — Telephone Encounter (Signed)
Confidential Office Message [address] [place], [place] [address] p. [phone] f. [phone] To: [facility] (After Hours Triage) Fax: [phone] From: [facility] Date/ Time: 08/31/2013 7:05 PM Taken By: Alphonsa Overall, RN Caller: Lyda Perone Facility: Not Collected Patient: Miguel Jimenez, Miguel Jimenez DOB: November 08, 1966 Phone: [phone] Reason for Call: Caller was unable to be reached on callback - Left Message Regarding Appointment: No Appt Date: Appt Time: Unknown Provider: Reason: Details: Outcome: Confidential

## 2013-09-07 ENCOUNTER — Other Ambulatory Visit: Payer: Self-pay

## 2013-09-07 ENCOUNTER — Ambulatory Visit: Payer: PRIVATE HEALTH INSURANCE | Admitting: Family Medicine

## 2013-09-07 MED ORDER — HYDROCODONE-ACETAMINOPHEN 10-325 MG PO TABS: 1.0000 | ORAL_TABLET | ORAL | Status: DC | PRN

## 2013-09-07 NOTE — Telephone Encounter (Signed)
Pt left vm requesting rx hydrocodone apap. Call if can pick up rx. Pt said hydrocodone is helping but pt is almost out of med and pt has f/u appt 09/15/13.Please advise.

## 2013-09-07 NOTE — Telephone Encounter (Signed)
That is a very large amount of pain medication to use in a short time. I definitely need to see him back for follow-up.

## 2013-09-08 NOTE — Telephone Encounter (Signed)
[place] notified prescription is ready to be picked up at front desk.

## 2013-09-15 ENCOUNTER — Ambulatory Visit: Payer: PRIVATE HEALTH INSURANCE | Admitting: Family Medicine

## 2013-09-21 ENCOUNTER — Encounter: Payer: Self-pay | Admitting: Family Medicine

## 2013-09-21 ENCOUNTER — Telehealth: Payer: Self-pay | Admitting: Family Medicine

## 2013-09-21 ENCOUNTER — Ambulatory Visit (INDEPENDENT_AMBULATORY_CARE_PROVIDER_SITE_OTHER): Payer: PRIVATE HEALTH INSURANCE | Admitting: Family Medicine

## 2013-09-21 VITALS — BP 127/99 | HR 93 | Temp 98.1°F | Ht 71.5 in | Wt 200.5 lb

## 2013-09-21 DIAGNOSIS — G4700 Insomnia, unspecified: Secondary | ICD-10-CM | POA: Insufficient documentation

## 2013-09-21 DIAGNOSIS — R41840 Attention and concentration deficit: Secondary | ICD-10-CM

## 2013-09-21 DIAGNOSIS — M549 Dorsalgia, unspecified: Secondary | ICD-10-CM

## 2013-09-21 DIAGNOSIS — F411 Generalized anxiety disorder: Secondary | ICD-10-CM

## 2013-09-21 DIAGNOSIS — N200 Calculus of kidney: Secondary | ICD-10-CM

## 2013-09-21 DIAGNOSIS — R109 Unspecified abdominal pain: Secondary | ICD-10-CM

## 2013-09-21 LAB — POCT UA - MICROSCOPIC ONLY

## 2013-09-21 LAB — POCT URINALYSIS DIPSTICK
Bilirubin, UA: NEGATIVE
Glucose, UA: NEGATIVE
Ketones, UA: NEGATIVE

## 2013-09-21 MED ORDER — TAMSULOSIN HCL 0.4 MG PO CAPS: 0.4000 mg | ORAL_CAPSULE | Freq: Every day | ORAL | Status: DC

## 2013-09-21 MED ORDER — ZOLPIDEM TARTRATE 10 MG PO TABS: 10.0000 mg | ORAL_TABLET | Freq: Every evening | ORAL | Status: DC | PRN

## 2013-09-21 MED ORDER — ALPRAZOLAM 0.25 MG PO TABS: 0.2500 mg | ORAL_TABLET | Freq: Three times a day (TID) | ORAL | Status: DC | PRN

## 2013-09-21 MED ORDER — HYDROCODONE-ACETAMINOPHEN 10-325 MG PO TABS: 1.0000 | ORAL_TABLET | ORAL | Status: DC | PRN

## 2013-09-21 MED ORDER — ONDANSETRON HCL 8 MG PO TABS: 8.0000 mg | ORAL_TABLET | Freq: Three times a day (TID) | ORAL | Status: DC | PRN

## 2013-09-21 NOTE — Telephone Encounter (Signed)
Patient Information:  Caller Name: [place]  Phone: [phone]  Patient: Miguel Jimenez, Miguel Jimenez  Gender: Male  DOB: [birth year]  Age: 47 Years  PCP: Hannah Beat [facility])  Office Follow Up:  Does the office need to follow up with this patient?: No  Instructions For The Office: N/A  RN Note:  Reports he has an appointment at 12:15pm. Advised patient to keep this scheduled appointment.  Symptoms  Reason For Call & Symptoms: Reports back pain which has been radiating around towards the front on the right side. Patient believes he may be having kidney stones. Reports it feels like glass when he uses the bathroom.  Reviewed Health History In EMR: Yes  Reviewed Medications In EMR: Yes  Reviewed Allergies In EMR: Yes  Reviewed Surgeries / Procedures: Yes  Date of Onset of Symptoms: 09/20/2013  Treatments Tried: Naproxen, Ibuprofen  Treatments Tried Worked: No  Guideline(s) Used:  No Protocol Available - Sick Adult  Disposition Per Guideline:   See Today in Office  Reason For Disposition Reached:   Nursing judgment  Advice Given:  N/A  Patient Will Follow Care Advice:  YES

## 2013-09-21 NOTE — Patient Instructions (Signed)
REFERRAL: GO THE THE FRONT ROOM AT THE ENTRANCE OF OUR CLINIC, NEAR CHECK IN. ASK FOR MARION. SHE WILL HELP YOU SET UP YOUR REFERRAL. DATE: TIME:  

## 2013-09-21 NOTE — Progress Notes (Signed)
Date:  09/21/2013   Name:  Miguel Jimenez   DOB:  30-Dec-1965   MRN:  [id number] Gender: male Age: 47 y.o.  Primary Physician:  Hannah Beat, MD   Chief Complaint: Flank Pain   History of Present Illness:  Miguel Jimenez is a 47 y.o. pleasant patient who presents with the following:  The patient is here for multiple complaints.  Pain there in his back. ? Passed a stone. This morning and maybe having one right now. He had severe pain, 10-10 earlier today, which woke him from sleep. He came to his bathroom was in there for about 10 minutes after straining for an extensive amount of time he did have immediate relief when he urinated. He is not clear if he passed a stone or not, and did not see anything.  Anxiety: The patient took some Lexapro for about one week, and then he discontinued it. He has been taking a Xanax anywhere from twice a day to 3 times a day. He will take anywhere from one to 2 tablets when needed. He has occasionally taken 3 tablets. Did not feel that well or right in the first week - some times.   He also complains of concern continued insomnia. He did try taking some Benadryl 2 tablets at night along with melatonin, and this did not help really at all. He is sleeping minimally at this point.  He is very stressed at work. He is here with his wife today, he has been more irritable at home also.  He also is having pain in his back where he fell off his deck 3 or 4 weeks ago. He continues to have some pain. He did have some anterior right rib cage pain before, but this is improved.  Patient Active Problem List   Diagnosis Date Noted  . Hyperthyroidism 08/05/2013  . Generalized anxiety disorder 08/05/2013  . Migraine     Past Medical History  Diagnosis Date  . Migraine   . Generalized anxiety disorder 08/05/2013    Past Surgical History  Procedure Laterality Date  . Knee surgery      History   Social History  . Marital Status: Married    Spouse Name: Miguel Jimenez      Number of Children: 2  . Years of Education: 16   Occupational History  . Nature conservation officer   Social History Main Topics  . Smoking status: Current Every Day Smoker -- 0.50 packs/day    Types: Cigarettes  . Smokeless tobacco: Former Neurosurgeon    Types: Snuff  . Alcohol Use: 0.5 oz/week    1 drink(s) per week     Comment: occasional  . Drug Use: No  . Sexual Activity: Yes    Partners: Female   Other Topics Concern  . Not on file   Social History Narrative   Physiological scientist   Married to former [employer]   2 children, 6 and 15    No family history on file.  No Known Allergies  Medication list has been reviewed and updated.  Outpatient Prescriptions Prior to Visit  Medication Sig Dispense Refill  . ALPRAZolam (XANAX) 0.25 MG tablet Take 1 tablet (0.25 mg total) by mouth 3 (three) times daily as needed for anxiety.  30 tablet  2  . ibuprofen (ADVIL,MOTRIN) 200 MG tablet Take 800 mg by mouth every 6 (six) hours as needed for pain.      . Melatonin 10 MG CAPS Take 1 capsule by  mouth at bedtime as needed.      . cyclobenzaprine (FLEXERIL) 10 MG tablet Take 1 tablet (10 mg total) by mouth 3 (three) times daily as needed for muscle spasms.  30 tablet  0  . escitalopram (LEXAPRO) 10 MG tablet Take 1 tablet (10 mg total) by mouth daily.  30 tablet  2  . HYDROcodone-acetaminophen (NORCO) 10-325 MG per tablet Take 1 tablet by mouth every 4 (four) hours as needed for pain.  40 tablet  0  . naproxen (NAPROSYN) 500 MG tablet Take 1 tablet (500 mg total) by mouth 2 (two) times daily.  30 tablet  0  . sildenafil (VIAGRA) 100 MG tablet Take 0.5-1 tablets (50-100 mg total) by mouth daily as needed for erectile dysfunction.  10 tablet  11   No facility-administered medications prior to visit.    Review of Systems:   GEN: No acute illnesses, no fevers, chills. GI: +nausea with stone Pulm: No SOB Pain and others as above Interactive and getting along well at  home.  Otherwise, ROS is as per the HPI.   Physical Examination: BP 127/99  Pulse 93  Temp(Src) 98.1 F (36.7 C) (Oral)  Ht 5' 11.5" (1.816 m)  Wt 200 lb 8 oz (90.946 kg)  BMI 27.58 kg/m2  Ideal Body Weight: Weight in (lb) to have BMI = 25: 181.4   GEN: WDWN, NAD, Non-toxic, A & O x 3 HEENT: Atraumatic, Normocephalic. Neck supple. No masses, No LAD. Ears and Nose: No external deformity. CV: RRR, No M/G/R. No JVD. No thrill. No extra heart sounds. PULM: CTA B, no wheezes, crackles, rhonchi. No retractions. No resp. distress. No accessory muscle use. EXTR: No c/c/e NEURO Normal gait.  PSYCH: He appears anxious.   Negative we will sign. Moderate tenderness along either side all along his erector spinae complex from L2-S1. Much muscle spasm.  Neurovascularly intact.  Assessment and Plan:  Nephrolithiasis  Flank pain - Plan: POCT Urinalysis Dipstick, POCT UA - Microscopic Only  Generalized anxiety disorder - Plan: Ambulatory referral to Psychology  Inattention - Plan: Ambulatory referral to Psychology  Back pain  Insomnia  >45 minutes spent in face to face time with patient, >50% spent in counselling or coordination of care: All of the above discussed at length. The patient and his wife also both bring up a concern that he may have ADHD. The patient did take Ritalin when he was younger. I am to have him do a formal ADHD evaluation through psychology to define if he does or does not have this problem. I have concerns that potential stimulants without a proper diagnosis may be harmful to this patient.  He refuses to take an SSRI. I am going to increase his Xanax to 3 times a day dosing. Also discussed this case with one of our partners, and I may think about starting some BuSpar or potentially Xanax XR in the future to help stabilize him.  Back pain, secondary to trauma. It is improving. I would continue with some anti-inflammatories, moist heat, massage.  Probable  nephrolithiasis with blood on his urine micro-. Push fluids, start Flomax, and given some narcotics for pain. If he continues to have significant symptoms through the weekend, but I think that we will need to image his kidneys.  Orders Today:  Orders Placed This Encounter  Procedures  . Ambulatory referral to Psychology    Referral Priority:  Routine    Referral Type:  Psychiatric    Referral Reason:  Specialty Services  Required    Requested Specialty:  Psychology    Number of Visits Requested:  1  . POCT Urinalysis Dipstick  . POCT UA - Microscopic Only    Updated Medication List: (Includes new medications, updates to list, dose adjustments) Meds ordered this encounter  Medications  . tamsulosin (FLOMAX) 0.4 MG CAPS capsule    Sig: Take 1 capsule (0.4 mg total) by mouth daily.    Dispense:  30 capsule    Refill:  1  . ondansetron (ZOFRAN) 8 MG tablet    Sig: Take 1 tablet (8 mg total) by mouth every 8 (eight) hours as needed for nausea.    Dispense:  30 tablet    Refill:  1  . HYDROcodone-acetaminophen (NORCO) 10-325 MG per tablet    Sig: Take 1 tablet by mouth every 4 (four) hours as needed for pain.    Dispense:  30 tablet    Refill:  0  . ALPRAZolam (XANAX) 0.25 MG tablet    Sig: Take 1 tablet (0.25 mg total) by mouth 3 (three) times daily as needed for anxiety.    Dispense:  90 tablet    Refill:  2  . zolpidem (AMBIEN) 10 MG tablet    Sig: Take 1 tablet (10 mg total) by mouth at bedtime as needed for sleep.    Dispense:  30 tablet    Refill:  2    Medications Discontinued: Medications Discontinued During This Encounter  Medication Reason  . escitalopram (LEXAPRO) 10 MG tablet   . naproxen (NAPROSYN) 500 MG tablet   . HYDROcodone-acetaminophen (NORCO) 10-325 MG per tablet Reorder  . ALPRAZolam (XANAX) 0.25 MG tablet Reorder      Signed,  Karleen Hampshire T. Alexio Sroka, MD, CAQ Sports Medicine  [facility] [address] [place]  [address] Phone: [phone] Fax: [phone]

## 2013-09-23 ENCOUNTER — Telehealth: Payer: Self-pay | Admitting: Family Medicine

## 2013-09-23 NOTE — Telephone Encounter (Signed)
Pt seen on Wed by Dr. Patsy Lager.  Requesting note to excuse him from work today (Friday) r/t kidney stones.

## 2013-09-23 NOTE — Telephone Encounter (Signed)
Await Dr. Cyndie Chime recs

## 2013-09-25 NOTE — Telephone Encounter (Signed)
Please help with note for last Friday for work.

## 2013-09-26 ENCOUNTER — Encounter: Payer: Self-pay | Admitting: *Deleted

## 2013-09-26 NOTE — Telephone Encounter (Signed)
[facility] notified letter is ready to be picked up at front desk.

## 2013-10-04 ENCOUNTER — Telehealth: Payer: Self-pay

## 2013-10-04 NOTE — Telephone Encounter (Signed)
Please call  I think that is a good idea - I had thought of it, too. If we switched to Xanax extended release (also generic) you only have to take it once a day and might be a good choice for him to keep even keeled all day long.  How many total pills is he taking a day - I need to know for dosing? (I think it is 2-3)   Hannah Beat, MD 10/04/2013, 5:32 PM

## 2013-10-04 NOTE — Telephone Encounter (Signed)
Pt left v/m requesting increase in dosage of Xanax so pt could take med once or twice daily instead of three times daily. Pt request cb.[facility] [address]

## 2013-10-05 MED ORDER — ALPRAZOLAM ER 1 MG PO TB24: 1.0000 mg | ORAL_TABLET | Freq: Every day | ORAL | Status: DC

## 2013-10-05 NOTE — Telephone Encounter (Signed)
Ok.  Have him stop the regular xanax and start  Xanax XR generic 1 mg, 1 po q 24 hours, #30, 2 refills. HE CANNOT TAKE MORE THAN ONE OF THESE IN A DAY  With ambien: if he takes it, he has to go immediately to bed. Decrease the dose to 1/2 a tablet or even 1/4 a tablet if problems with 1/2 a tablet.

## 2013-10-05 NOTE — Telephone Encounter (Signed)
Left message for patient to return my call.

## 2013-10-05 NOTE — Telephone Encounter (Signed)
Spoke with Miguel Jimenez.  He states he normally takes 3 tablets a day sometime 4 of Xanex if things are real bad.  He also wanted to let Dr. Patsy Lager know that the Ambien he prescribed is ''Crazy".  When I ask him to clarify crazy.  He states when he takes it, he can't remember what happened the night before.  Wanting to know if that is normal?  Also wanted to let Dr. Patsy Lager know he is following thru with the ADHD evaluation.  Has an appointment set up to be tested.  Will forward to Dr. Patsy Lager for review.

## 2013-10-05 NOTE — Telephone Encounter (Signed)
Called to [facility] [address] 

## 2013-10-05 NOTE — Telephone Encounter (Signed)
Lyda Perone notified as instructed by telephone.

## 2013-10-20 ENCOUNTER — Ambulatory Visit: Payer: PRIVATE HEALTH INSURANCE | Admitting: Psychology

## 2013-10-25 ENCOUNTER — Ambulatory Visit: Payer: PRIVATE HEALTH INSURANCE | Admitting: Psychology

## 2013-11-22 ENCOUNTER — Ambulatory Visit (INDEPENDENT_AMBULATORY_CARE_PROVIDER_SITE_OTHER): Payer: PRIVATE HEALTH INSURANCE | Admitting: Family Medicine

## 2013-11-22 ENCOUNTER — Encounter: Payer: Self-pay | Admitting: Family Medicine

## 2013-11-22 VITALS — BP 106/78 | HR 60 | Temp 97.7°F | Ht 71.5 in | Wt 204.5 lb

## 2013-11-22 DIAGNOSIS — G4700 Insomnia, unspecified: Secondary | ICD-10-CM

## 2013-11-22 DIAGNOSIS — F411 Generalized anxiety disorder: Secondary | ICD-10-CM

## 2013-11-22 DIAGNOSIS — J019 Acute sinusitis, unspecified: Secondary | ICD-10-CM

## 2013-11-22 DIAGNOSIS — K047 Periapical abscess without sinus: Secondary | ICD-10-CM

## 2013-11-22 MED ORDER — HYDROCODONE-ACETAMINOPHEN 10-325 MG PO TABS: 1.0000 | ORAL_TABLET | Freq: Four times a day (QID) | ORAL | Status: DC | PRN

## 2013-11-22 MED ORDER — ESZOPICLONE 2 MG PO TABS: 2.0000 mg | ORAL_TABLET | Freq: Every evening | ORAL | Status: DC | PRN

## 2013-11-22 MED ORDER — HYDROCOD POLST-CHLORPHEN POLST 10-8 MG/5ML PO LQCR: 5.0000 mL | Freq: Every evening | ORAL | Status: DC | PRN

## 2013-11-22 MED ORDER — AMOXICILLIN-POT CLAVULANATE 875-125 MG PO TABS: 1.0000 | ORAL_TABLET | Freq: Two times a day (BID) | ORAL | Status: DC

## 2013-11-22 NOTE — Progress Notes (Signed)
Date:  11/22/2013   Name:  Miguel Jimenez   DOB:  Oct 19, 1966   MRN:  [id number] Gender: male Age: 47 y.o.  Primary Physician:  Hannah Beat, MD   Chief Complaint: Sinusitis and Dental Pain   Subjective:   History of Present Illness:  Miguel Jimenez is a 46 y.o. very pleasant male patient who presents with the following:  The patient has multiple symptoms, and he has been sick for approaching 10 days with some cold symptoms, and he now has had some worsening maxillary sinus pain.  He also over the last few days as developed some severe tooth pain and his wisdom tooth is coming in through the roof of his mouth. Oral surgery was unable to see him urgently, so they asked him to come to my office.  He also still having significant problems with insomnia. He is taking up to 30 mg of melatonin a day. He also is taking 50-75 mg of Benadryl.  Wisdom tooth is coming through - has an abscessed tooth on the Right.   Week or so, sinus pain.  Called oral surgeon and has follow up Everyone is sick at work.   Past Medical History, Surgical History, Social History, Family History, Problem List, Medications, and Allergies have been reviewed and updated if relevant.  Review of Systems: ROS: GEN: Acute illness details above GI: Tolerating PO intake GU: maintaining adequate hydration and urination Pulm: No SOB Interactive and getting along well at home.  Otherwise, ROS is as per the HPI.   Objective:   Physical Examination: BP 106/78  Pulse 60  Temp(Src) 97.7 F (36.5 C) (Oral)  Ht 5' 11.5" (1.816 m)  Wt 204 lb 8 oz (92.761 kg)  BMI 28.13 kg/m2   Gen: WDWN, NAD; alert,appropriate and cooperative throughout exam  HEENT: Normocephalic and atraumatic. Throat clear, w/o exudate, no LAD, R TM clear, L TM - good landmarks, No fluid present. rhinnorhea. R wisdom tooth / whitening of roof on R Left frontal and maxillary sinuses: Tender Right frontal and maxillary sinuses: Tender  Neck:  No ant or post LAD CV: RRR, No M/G/R Pulm: Breathing comfortably in no resp distress. no w/c/r Abd: S,NT,ND,+BS Extr: no c/c/e Psych: full affect, pleasant    Laboratory and Imaging Data:  Assessment & Plan:    Dental abscess  Insomnia  Generalized anxiety disorder  Acute sinusitis  Treat sinusitis and dental abscess with Augmentin. Followup with oral surgery.  Trial of Lunesta for sleep.  Patient Instructions  Be very cautious taking the Lunesta at the same time as Tussionex - I would NOT mix.  When feeling better and off cough meds, ok to take Lunesta right before bed.   Orders Today:  No orders of the defined types were placed in this encounter.    New medications, updates to list, dose adjustments: Meds ordered this encounter  Medications  . eszopiclone (LUNESTA) 2 MG TABS tablet    Sig: Take 1 tablet (2 mg total) by mouth at bedtime as needed for sleep. Take immediately before bedtime    Dispense:  30 tablet    Refill:  3  . amoxicillin-clavulanate (AUGMENTIN) 875-125 MG per tablet    Sig: Take 1 tablet by mouth 2 (two) times daily.    Dispense:  20 tablet    Refill:  0  . chlorpheniramine-HYDROcodone (TUSSIONEX) 10-8 MG/5ML LQCR    Sig: Take 5 mLs by mouth at bedtime as needed for cough.    Dispense:  240 mL  Refill:  0  . HYDROcodone-acetaminophen (NORCO) 10-325 MG per tablet    Sig: Take 1 tablet by mouth every 6 (six) hours as needed for severe pain.    Dispense:  50 tablet    Refill:  0    Signed,  James Lafalce T. Denim Kalmbach, MD, CAQ Sports Medicine  [facility] [address] [place] [address] Phone: [phone] Fax: [phone]  Updated Complete Medication List:   Medication List       This list is accurate as of: 11/22/13 11:59 PM.  Always use your most recent med list.               ALPRAZolam 1 MG 24 hr tablet  Commonly known as:  XANAX XR  Take 1 tablet (1 mg total) by mouth daily.      amoxicillin-clavulanate 875-125 MG per tablet  Commonly known as:  AUGMENTIN  Take 1 tablet by mouth 2 (two) times daily.     chlorpheniramine-HYDROcodone 10-8 MG/5ML Lqcr  Commonly known as:  TUSSIONEX  Take 5 mLs by mouth at bedtime as needed for cough.     eszopiclone 2 MG Tabs tablet  Commonly known as:  LUNESTA  Take 1 tablet (2 mg total) by mouth at bedtime as needed for sleep. Take immediately before bedtime     HYDROcodone-acetaminophen 10-325 MG per tablet  Commonly known as:  NORCO  Take 1 tablet by mouth every 6 (six) hours as needed for severe pain.     ibuprofen 200 MG tablet  Commonly known as:  ADVIL,MOTRIN  Take 800 mg by mouth every 6 (six) hours as needed for pain.     Melatonin 10 MG Caps  Take 3 capsules by mouth at bedtime as needed.

## 2013-11-22 NOTE — Progress Notes (Signed)
Pre-visit discussion using our clinic review tool. No additional management support is needed unless otherwise documented below in the visit note.  

## 2013-11-22 NOTE — Patient Instructions (Signed)
Be very cautious taking the Lunesta at the same time as Tussionex - I would NOT mix.  When feeling better and off cough meds, ok to take Lunesta right before bed.

## 2013-11-28 ENCOUNTER — Telehealth: Payer: Self-pay | Admitting: *Deleted

## 2013-11-28 NOTE — Telephone Encounter (Signed)
Mahesh left Doctor, hospital on my phone.  States he can't get in to see the dentist until 01.08.2014.  Asking for a different pain medication that does not have acetaminophen in it due to his stomach issues.  Also states that he was only able to get a "small" bottle of Tussinex at the pharmacy and would like to get a refill on that. He did state he was at work today.   When I tried to return his call to get more information, the number he left states voicemail has not been set up and the number we have listed in his chart states it is no longer in service.

## 2013-11-29 ENCOUNTER — Telehealth: Payer: Self-pay | Admitting: Family Medicine

## 2013-11-29 NOTE — Telephone Encounter (Signed)
Miguel Jimenez left voicemail stating he saw where I had tried to call him but he didn't have his voicemail set up yet.  States he now has it set and to leave him a message.  Returned call and left message on cell phone voicemail with information provided below from Dr. Patsy Lager.

## 2013-11-29 NOTE — Telephone Encounter (Signed)
donna 

## 2013-11-29 NOTE — Telephone Encounter (Signed)
[facility] Triage Call Report Triage Record Num: [id number] Operator: Estevan Oaks Patient Name: Miguel Jimenez Call Date & Time: 11/28/2013 6:43:24PM Patient Phone: [phone] PCP: Hannah Beat Patient Gender: Male PCP Fax : [phone] Patient DOB: 06-23-1966 Practice Name: [facility] Reason for Call: Caller: [name]/Patient; PCP: Hannah Beat (Family Practice); CB#: [phone]; [name] had called office today to ask about a change in his pain medication. He says he missed the office's call back and due to having a new phone, he's not got his voicemail set up so she could not leave him a message. Rn reviewed chart and advised that her note states the same thing and she had not yet gotten to the point of discussing with the MD and no new meds were ordered. He says he will call back tomorrow. Protocol(s) Used: Office Note Recommended Outcome per Protocol: Information Noted and Sent to Office Reason for Outcome: Caller information to office Care Advice: ~ 11/28/2013 6:48:04PM Page 1 of 1 [email or web address]

## 2013-11-29 NOTE — Telephone Encounter (Signed)
I will not be giving additional narcotics.   He was given #240 mL of Tussionex. This is 48 doses. And #50 of Norco 10-325.  This is a large volume of narcotics. Tylenol basically never causes stomach problems.    Non-negotiable. I will also carbon copy my partner in this case, too. Hannah Beat, MD

## 2013-12-26 ENCOUNTER — Ambulatory Visit (INDEPENDENT_AMBULATORY_CARE_PROVIDER_SITE_OTHER): Payer: PRIVATE HEALTH INSURANCE | Admitting: Family Medicine

## 2013-12-26 ENCOUNTER — Encounter: Payer: Self-pay | Admitting: Family Medicine

## 2013-12-26 ENCOUNTER — Telehealth: Payer: Self-pay | Admitting: Family Medicine

## 2013-12-26 VITALS — BP 120/90 | HR 72 | Temp 98.3°F | Ht 71.5 in | Wt 201.0 lb

## 2013-12-26 DIAGNOSIS — J0100 Acute maxillary sinusitis, unspecified: Secondary | ICD-10-CM

## 2013-12-26 DIAGNOSIS — J0101 Acute recurrent maxillary sinusitis: Secondary | ICD-10-CM

## 2013-12-26 DIAGNOSIS — F411 Generalized anxiety disorder: Secondary | ICD-10-CM

## 2013-12-26 DIAGNOSIS — N529 Male erectile dysfunction, unspecified: Secondary | ICD-10-CM | POA: Insufficient documentation

## 2013-12-26 MED ORDER — ALPRAZOLAM 0.25 MG PO TABS: 0.2500 mg | ORAL_TABLET | Freq: Three times a day (TID) | ORAL | Status: DC | PRN

## 2013-12-26 MED ORDER — LEVOFLOXACIN 500 MG PO TABS: 500.0000 mg | ORAL_TABLET | Freq: Every day | ORAL | Status: DC

## 2013-12-26 MED ORDER — HYDROCOD POLST-CHLORPHEN POLST 10-8 MG/5ML PO LQCR: 5.0000 mL | Freq: Every evening | ORAL | Status: DC | PRN

## 2013-12-26 NOTE — Telephone Encounter (Signed)
[facility] [address] and [facility] [address] Spoke with pharmacy rep in each location.  Advised pharmacies to stop prescriptions for Ambien, Lunesta, and Xanax XR.

## 2013-12-26 NOTE — Progress Notes (Signed)
Patient Name: Miguel MilchKirk Oliveira Date of Birth: 06/07/1966 Medical Record Number: [id number]  History of Present Illness:  Patent presents with runny nose, sneezing, cough, sore throat, malaise and minimal / low-grade fever for > 1 week. Now the primary complaint has become sinus pressure and pain behind the eyes and in the upper, anterior face.   Recurrent sinusitis. I treated him last with augmentin in 11/22/2014 for sinusitis. At the time, he thought that he had a tooth abscess, but oral surgery did not think this was the case.   A few years ago went to ENT in [place]. Ended up not having not a infected tooth.   The patent denies sore throat as the primary complaint. Denies sthortness of breath/wheezing, high fever, chest pain, significant myalgia, otalgia, abdominal pain, changes in bowel or bladder.  PMH, PHS, Allergies, Problem List, Medications, Family History, and Social History have all been reviewed.  Patient Active Problem List   Diagnosis Date Noted  . Erectile dysfunction 12/26/2013  . Insomnia 09/21/2013  . Nephrolithiasis 09/21/2013  . Generalized anxiety disorder 08/05/2013  . Migraine     Past Medical History  Diagnosis Date  . Migraine   . Generalized anxiety disorder 08/05/2013    Past Surgical History  Procedure Laterality Date  . Knee surgery      History   Social History  . Marital Status: Married    Spouse Name: Junius RoadsKena    Number of Children: 2  . Years of Education: 16   Occupational History  . [employer] conservation officeraerospace     Engineer   Social History Main Topics  . Smoking status: Current Every Day Smoker -- 0.50 packs/day    Types: Cigarettes  . Smokeless tobacco: Former NeurosurgeonUser    Types: Snuff  . Alcohol Use: 0.5 oz/week    1 drink(s) per week     Comment: occasional  . Drug Use: No  . Sexual Activity: Yes    Partners: Female   Other Topics Concern  . Not on file   Social History Narrative   Physiological scientistAerospace Engineer   Married to former Northwest AirlinesKena Childers   2  children, 6 and 15    No family history on file.  No Known Allergies  Medication list reviewed and updated in full in [facility].  Review of Systems: as above, eating and drinking - tolerating PO. Urinating normally. No excessive vomitting or diarrhea. O/w as above.  Physical Exam:  Filed Vitals:   12/26/13 1011  BP: 120/90  Pulse: 72  Temp: 98.3 F (36.8 C)  TempSrc: Oral  Height: 5' 11.5" (1.816 m)  Weight: 201 lb (91.173 kg)    GEN: WDWN, Non-toxic, Atraumatic, normocephalic. A and O x 3. HEENT: Oropharynx clear without exudate, MMM, no significant LAD, mild rhinnorhea Sinuses: Right Frontal, ethmoid, and maxillary: Tender Left Frontal, Ethmoid, and maxillary: Tender Ears: TM clear, COL visualized with good landmarks CV: RRR, no m/g/r. Pulm: CTA B, no wheezes, rhonchi, or crackles, normal respiratory effort. EXT: no c/c/e Psych: well oriented, neither depressed nor anxious in appearance  Assessment and Plan:  Acute recurrent maxillary sinusitis  Generalized anxiety disorder  Erectile dysfunction    Acute sinusitis: ABX as below.   Reviewed symptomatic care as well as ABX in this case.   [facility] also [facility] all [facility] for [place], lunesta, and Xanax XR.   He stopped xanax xr, did not like how he felt. So, I am going to put back  on IR Xanax. He refuses to take an anti-depressant again.   Meds ordered this encounter  Medications  . levofloxacin (LEVAQUIN) 500 MG tablet    Sig: Take 1 tablet (500 mg total) by mouth daily.    Dispense:  14 tablet    Refill:  0  . chlorpheniramine-HYDROcodone (TUSSIONEX) 10-8 MG/5ML LQCR    Sig: Take 5 mLs by mouth at bedtime as needed for cough.    Dispense:  115 mL    Refill:  0  . ALPRAZolam (XANAX) 0.25 MG tablet    Sig: Take 1 tablet (0.25 mg total) by mouth 3 (three) times daily as needed for anxiety.    Dispense:  90 tablet    Refill:  1    Patient Instructions  Reviewed: SINUSITIS Sinuses are cavities in facial skeleton that drain to nose. Impaired drainage and obstruction of sinus passages main cause.  Treatment: 1. Take all Antibiotics 2. Open nasal and sinus canals: Oral decongestant: Sudafed. (CAUTION IF HIGH BLOOD PRESSURE) 3. Steam inhalation 4. Humidifier in room 5. Frequent nasal saline irrigation 6. Moist heat compresses to face 7. Tylenol or Ibuprofen for pain and fever, follow directions on bottle.   Signed,  Elpidio Galea. Keeana Pieratt, MD, CAQ Sports Medicine  [facility] [address] [place] [address] Phone: [phone] Fax: [phone]

## 2013-12-26 NOTE — Progress Notes (Signed)
Pre-visit discussion using our clinic review tool. No additional management support is needed unless otherwise documented below in the visit note.  

## 2013-12-26 NOTE — Telephone Encounter (Signed)
Please help me:  Can you call [facility] [address] [address] [place]  Please make sure all prescriptions for [place], lunesta, and xanax XR are stopped for this patient. Deny any other refills.  He should have 1 bottle 115 mL of tussionex only and a new script for immediate release xanax 0.25 tid prn only, #90.   Hannah BeatSpencer Seleny Allbright, MD

## 2014-01-01 ENCOUNTER — Other Ambulatory Visit: Payer: Self-pay | Admitting: Family Medicine

## 2014-01-03 ENCOUNTER — Telehealth: Payer: Self-pay | Admitting: Family Medicine

## 2014-01-03 NOTE — Telephone Encounter (Signed)
Relevant patient education assigned to patient using Emmi. ° °

## 2014-01-19 ENCOUNTER — Ambulatory Visit (INDEPENDENT_AMBULATORY_CARE_PROVIDER_SITE_OTHER): Payer: PRIVATE HEALTH INSURANCE | Admitting: Family Medicine

## 2014-01-19 ENCOUNTER — Encounter: Payer: Self-pay | Admitting: *Deleted

## 2014-01-19 ENCOUNTER — Encounter: Payer: Self-pay | Admitting: Family Medicine

## 2014-01-19 VITALS — BP 120/84 | HR 83 | Temp 97.6°F | Ht 71.5 in | Wt 201.5 lb

## 2014-01-19 DIAGNOSIS — M5116 Intervertebral disc disorders with radiculopathy, lumbar region: Secondary | ICD-10-CM

## 2014-01-19 DIAGNOSIS — IMO0002 Reserved for concepts with insufficient information to code with codable children: Secondary | ICD-10-CM

## 2014-01-19 DIAGNOSIS — M5126 Other intervertebral disc displacement, lumbar region: Secondary | ICD-10-CM

## 2014-01-19 DIAGNOSIS — M5416 Radiculopathy, lumbar region: Secondary | ICD-10-CM

## 2014-01-19 MED ORDER — PREDNISONE 20 MG PO TABS: ORAL_TABLET | ORAL | Status: DC

## 2014-01-19 MED ORDER — HYDROCODONE-ACETAMINOPHEN 5-325 MG PO TABS: 1.0000 | ORAL_TABLET | Freq: Every evening | ORAL | Status: DC | PRN

## 2014-01-19 MED ORDER — CYCLOBENZAPRINE HCL 10 MG PO TABS: 10.0000 mg | ORAL_TABLET | Freq: Every day | ORAL | Status: DC

## 2014-01-19 NOTE — Progress Notes (Signed)
Pre-visit discussion using our clinic review tool. No additional management support is needed unless otherwise documented below in the visit note.  

## 2014-01-19 NOTE — Progress Notes (Signed)
Patient Name: Miguel MilchKirk Jimenez Date of Birth: 04/02/1966 Medical Record Number: [id number] Gender: male  PCP: Hannah BeatSpencer Helios Kohlmann, MD  History of Present Illness:  Miguel Jimenez is a 48 y.o. very pleasant male patient who presents with the following: Back Pain  ongoing for approximately: 2 weeks - 3 weeks The patient has had back pain before. The back pain is localized into the lumbar spine area. R posterior leg radiculopathy down to toes.  A couple of weeks ago, and back was hurting really bad for a couple of days and it has been ongoing. Monday pushing at table at work. Felt something warm on his back. Tips of toes feel prickly - down the back of leg. Monday it has gotten worse. Getting on the heating pad. Some tingling, radicular symptoms.   07/2013 injury. Having some toe numbness. Every morning has a charlie horse type sensation. Tingles in her feet. Prickles in toes.   Alleve bid.   No numbness or tingling. No bowel or bladder incontinence. No focal weakness. Prior interventions: nsaids, heat Physical therapy: No Chiropractic manipulations: No Acupuncture: No Osteopathic manipulation: No  Past Medical History, Surgical History, Family History, Medications, Allergies have been reviewed and updated if relevant.  Review of Systems  GEN: No fevers, chills. Nontoxic. Primarily MSK c/o today. MSK: Detailed in the HPI GI: tolerating PO intake without difficulty Neuro: As above  Otherwise the pertinent positives of the ROS are noted above.    Physical Exam  Filed Vitals:   01/19/14 1125  BP: 120/84  Pulse: 83  Temp: 97.6 F (36.4 C)  TempSrc: Oral  Height: 5' 11.5" (1.816 m)  Weight: 201 lb 8 oz (91.4 kg)    Gen: Well-developed,well-nourished,in no acute distress; alert,appropriate and cooperative throughout examination HEENT: Normocephalic and atraumatic without obvious abnormalities.  Ears, externally no deformities Pulm: Breathing comfortably in no respiratory  distress Range of motion at  the waist: Flexion, rotation and lateral bending: Flexion to 80, ext normal, lateral bending loss 40 deg  No echymosis or edema Rises to examination table with moderate difficulty Gait: minimally antalgic  Inspection/Deformity: No abnormality Paraspinus T:  Diffusely tender and in spasm l2-s1 b  B Ankle Dorsiflexion (L5,4): 4/5 on the RIGHT B Great Toe Dorsiflexion (L5,4): 5/5 Heel Walk (L5): OCC FOOT DROP Toe Walk (S1): WNL Rise/Squat (L4): WNL, mild pain  SENSORY B Medial Foot (L4): WNL B Dorsum (L5):  - decreased at webspace between 1 and 2 on the right, foot remains intact B Lateral (S1): WNL Light Touch: as above Pinprick: as above  REFLEXES Knee (L4): 2+ Ankle (S1): 2+  B SLR, seated: POSITIVE AT 40 DEG B SLR, supine: POS AT 15 DEG B FABER: neg B Reverse FABER: neg B Greater Troch: NT B Log Roll: neg B Stork: NT B Sciatic Notch: NT  Dg Thoracic Spine 2 View  07/11/2013   *RADIOLOGY REPORT*  Clinical Data: Back pain.  Upper and lower back pain.  Fall.  THORACIC SPINE - 2 VIEW  Comparison: Chest radiograph 12/15/2012.  Findings: Thoracic spine exam is technically degraded.  There is mild dextroconvex curve of the upper thoracic spine which may be positional.  Vertebral body height appears preserved on the frontal views.  Paraspinal lines appear within normal limits.  There is no fracture identified.  Swimmer's view is technically suboptimal.  IMPRESSION: No gross acute abnormality.   Original Report Authenticated By: Andreas NewportGeoffrey Lamke, M.D.   Dg Lumbar Spine Complete  07/11/2013   *RADIOLOGY REPORT*  Clinical  Data: Back pain.Fall.  LUMBAR SPINE - COMPLETE 4+ VIEW  Comparison: None.  Findings: Five lumbar type vertebral bodies are present.  Vertebral body height appears preserved.  There are no pars defects.  The alignment is anatomic.  Mild degenerative disc disease is present at L3-L4, L4-L5 and L5-S1.  Transverse processes appear within normal  limits.  IMPRESSION: Mild lumbar spondylosis.  No acute osseous abnormality.   Original Report Authenticated By: Andreas Newport, M.D.   Lumbar radiculopathy, acute  Lumbar disc herniation with radiculopathy   Probable disc herniation with nerve encroachment. Initial conservative management first. Strength and sensory loss more concerning. Gentle stretching. F/u 3 weeks.  If worsens at all, f/u sooner. Discussed all with patient and his wife.   Meds ordered this encounter  Medications  . naproxen sodium (ALEVE) 220 MG tablet    Sig: Take 220 mg by mouth 2 (two) times daily with a meal.  . predniSONE (DELTASONE) 20 MG tablet    Sig: 2 tabs po daily for 7 days, then 1 tabs po daily    Dispense:  21 tablet    Refill:  0  . cyclobenzaprine (FLEXERIL) 10 MG tablet    Sig: Take 1 tablet (10 mg total) by mouth at bedtime.    Dispense:  30 tablet    Refill:  1  . HYDROcodone-acetaminophen (NORCO/VICODIN) 5-325 MG per tablet    Sig: Take 1 tablet by mouth at bedtime as needed for moderate pain.    Dispense:  30 tablet    Refill:  0    Patient Instructions:  Windell Moment T. Chaundra Abreu, MD, CAQ Sports Medicine  [facility] [address] [place] [address] Phone: [phone] Fax: [phone]

## 2014-01-19 NOTE — Patient Instructions (Signed)
F/u 3 weeks

## 2014-02-01 ENCOUNTER — Emergency Department: Payer: Self-pay

## 2014-02-15 ENCOUNTER — Encounter: Payer: Self-pay | Admitting: Radiology

## 2014-02-16 ENCOUNTER — Ambulatory Visit: Payer: PRIVATE HEALTH INSURANCE | Admitting: Family Medicine

## 2014-02-17 ENCOUNTER — Ambulatory Visit: Payer: PRIVATE HEALTH INSURANCE | Admitting: Family Medicine

## 2014-02-17 DIAGNOSIS — Z0289 Encounter for other administrative examinations: Secondary | ICD-10-CM

## 2014-03-02 ENCOUNTER — Encounter: Payer: Self-pay | Admitting: Family Medicine

## 2014-03-02 ENCOUNTER — Ambulatory Visit (INDEPENDENT_AMBULATORY_CARE_PROVIDER_SITE_OTHER): Payer: PRIVATE HEALTH INSURANCE | Admitting: Family Medicine

## 2014-03-02 VITALS — BP 110/90 | HR 75 | Temp 98.1°F | Ht 71.5 in | Wt 205.5 lb

## 2014-03-02 DIAGNOSIS — M5116 Intervertebral disc disorders with radiculopathy, lumbar region: Secondary | ICD-10-CM

## 2014-03-02 DIAGNOSIS — M5416 Radiculopathy, lumbar region: Secondary | ICD-10-CM

## 2014-03-02 DIAGNOSIS — IMO0002 Reserved for concepts with insufficient information to code with codable children: Secondary | ICD-10-CM

## 2014-03-02 DIAGNOSIS — M5126 Other intervertebral disc displacement, lumbar region: Secondary | ICD-10-CM

## 2014-03-02 MED ORDER — CYCLOBENZAPRINE HCL 10 MG PO TABS: 10.0000 mg | ORAL_TABLET | Freq: Every day | ORAL | Status: DC

## 2014-03-02 MED ORDER — HYDROCODONE-ACETAMINOPHEN 5-325 MG PO TABS: 1.0000 | ORAL_TABLET | Freq: Every evening | ORAL | Status: DC | PRN

## 2014-03-02 MED ORDER — ALPRAZOLAM 0.25 MG PO TABS: 0.2500 mg | ORAL_TABLET | Freq: Three times a day (TID) | ORAL | Status: DC | PRN

## 2014-03-02 MED ORDER — PREGABALIN 75 MG PO CAPS: 75.0000 mg | ORAL_CAPSULE | Freq: Two times a day (BID) | ORAL | Status: DC

## 2014-03-02 NOTE — Progress Notes (Signed)
Patient Name: Miguel Jimenez Beane Date of Birth: 02/17/1966 Medical Record Number: [id number] Gender: male  PCP: Hannah BeatSpencer Carra Brindley, MD  History of Present Illness:  Miguel Jimenez Cavey is a 48 y.o. very pleasant male patient who presents with the following: Back Pain  F/u R lumbar radiculopathy: the patient is here in followup for significant and severe low back pain with right-sided radiculopathy, numbness, weakness, and footdrop. All this started greater than 2 months ago, and the patient missed his initial followup appointment. Now, it is now approximately 6 weeks since my initial evaluation.  At that time, I placed him on some prednisone, Flexeril, pain medications. He has been doing a little bit of some stretching. He is having a market amount of pain, sitting on his side, and radiculopathy much of the time. He is also still having significant footdrop on the RIGHT, he also has significant numbness on the lateral aspect of his lower extremity.  He was having so much pain that he had to go to the emergency room for evaluation. They did repeat lumbar spine x-rays, which were reportedly grossly normal with one noted osteophyte. I don't have those from review currently, but I am obtaining them from the emergency room's records.  01/19/2014 Last OV with Hannah BeatSpencer Nyja Westbrook, MD  ongoing for approximately: 2 weeks - 3 weeks The patient has had back pain before. The back pain is localized into the lumbar spine area. R posterior leg radiculopathy down to toes.  A couple of weeks ago, and back was hurting really bad for a couple of days and it has been ongoing. Monday pushing at table at work. Felt something warm on his back. Tips of toes feel prickly - down the back of leg. Monday it has gotten worse. Getting on the heating pad. Some tingling, radicular symptoms.   07/2013 injury. Having some toe numbness. Every morning has a charlie horse type sensation. Tingles in her feet. Prickles in toes.   Alleve bid.   No  numbness or tingling. No bowel or bladder incontinence. No focal weakness. Prior interventions: nsaids, heat Physical therapy: No Chiropractic manipulations: No Acupuncture: No Osteopathic manipulation: No  Past Medical History, Surgical History, Family History, Medications, Allergies have been reviewed and updated if relevant.  Review of Systems  GEN: No fevers, chills. Nontoxic. Primarily MSK c/o today. MSK: Detailed in the HPI GI: tolerating PO intake without difficulty Neuro: As above  Otherwise the pertinent positives of the ROS are noted above.    Physical Exam  Filed Vitals:   03/02/14 1812  BP: 110/90  Pulse: 75  Temp: 98.1 F (36.7 C)  TempSrc: Oral  Height: 5' 11.5" (1.816 m)  Weight: 205 lb 8 oz (93.214 kg)    Gen: Well-developed,well-nourished,in no acute distress; alert,appropriate and cooperative throughout examination HEENT: Normocephalic and atraumatic without obvious abnormalities.  Ears, externally no deformities Pulm: Breathing comfortably in no respiratory distress Range of motion at  the waist: Flexion, rotation and lateral bending: Flexion to 80, ext normal, lateral bending loss 40 deg  No echymosis or edema Rises to examination table with moderate difficulty Gait: minimally antalgic  Inspection/Deformity: No abnormality Paraspinus T:  Diffusely tender and in spasm l2-s1 b  B Ankle Dorsiflexion (L5,4): 4-/5 on the RIGHT B Great Toe Dorsiflexion (L5,4): 4-/5 Heel Walk (L5): OCC FOOT DROP, difficult Toe Walk (S1): WNL Rise/Squat (L4): WNL, mild pain Knee ext, 4-/5  SENSORY B Medial Foot (L4): WNL B Dorsum (L5):  - decreased at webspace between 1 and 2 on  the right, foot remains intact B Lateral (S1): WNL Light Touch: decreased on lateral aspect of LE, R Pinprick: decreased on lateral aspect of LE, R  REFLEXES Knee (L4): 2+ Ankle (S1): 2+  B SLR, seated: POSITIVE AT 40 DEG B SLR, supine: POS AT 15 DEG B FABER: neg B Reverse FABER:  neg B Greater Troch: NT B Log Roll: neg B Stork: NT B Sciatic Notch: NT  Dg Thoracic Spine 2 View  07/11/2013   *RADIOLOGY REPORT*  Clinical Data: Back pain.  Upper and lower back pain.  Fall.  THORACIC SPINE - 2 VIEW  Comparison: Chest radiograph 12/15/2012.  Findings: Thoracic spine exam is technically degraded.  There is mild dextroconvex curve of the upper thoracic spine which may be positional.  Vertebral body height appears preserved on the frontal views.  Paraspinal lines appear within normal limits.  There is no fracture identified.  Swimmer's view is technically suboptimal.  IMPRESSION: No gross acute abnormality.   Original Report Authenticated By: Andreas Newport, M.D.   Dg Lumbar Spine Complete  07/11/2013   *RADIOLOGY REPORT*  Clinical Data: Back pain.Fall.  LUMBAR SPINE - COMPLETE 4+ VIEW  Comparison: None.  Findings: Five lumbar type vertebral bodies are present.  Vertebral body height appears preserved.  There are no pars defects.  The alignment is anatomic.  Mild degenerative disc disease is present at L3-L4, L4-L5 and L5-S1.  Transverse processes appear within normal limits.  IMPRESSION: Mild lumbar spondylosis.  No acute osseous abnormality.   Original Report Authenticated By: Andreas Newport, M.D.   Lumbar disc herniation with radiculopathy - Plan: MR Lumbar Spine Wo Contrast  Lumbar radiculopathy, acute - Plan: MR Lumbar Spine Wo Contrast   Markedly abnormal neurological exam.dramatically decreased sensation on the RIGHT lateral aspect of leg. The patient also is having foot drop. Notable weakness at great toe extension, dorsiflexion, and knee extension on the RIGHT side. Markedly inducible radiculopathy. Clinical concern for significant foraminal encroachment at L4-5 or L5-S1, spinal cord impingement, spinal cord edema, or other occult cord process is possible. Obtain an MRI of the lumbar spine without contrast. Recent lumbar spine films are grossly normal within the last several  weeks, and [facility] were [facility]. MRI will determine next course of action, whether neurosurgical consultation is most appropriate versus interventional spine consultation.  Refill the patient's Flexeril. Trial of Lyrica for her neuropathic pain and radiculopathy.  Meds ordered this encounter  Medications  . ALPRAZolam (XANAX) 0.25 MG tablet    Sig: Take 1 tablet (0.25 mg total) by mouth 3 (three) times daily as needed for anxiety.    Dispense:  90 tablet    Refill:  1  . cyclobenzaprine (FLEXERIL) 10 MG tablet    Sig: Take 1 tablet (10 mg total) by mouth at bedtime.    Dispense:  30 tablet    Refill:  1  . HYDROcodone-acetaminophen (NORCO/VICODIN) 5-325 MG per tablet    Sig: Take 1 tablet by mouth at bedtime as needed for moderate pain.    Dispense:  30 tablet    Refill:  0  . pregabalin (LYRICA) 75 MG capsule    Sig: Take 1 capsule (75 mg total) by mouth 2 (two) times daily.    Dispense:  60 capsule    Refill:  2    Patient Instructions:  Signed,  Karleen Hampshire T. Islay Polanco, MD, CAQ Sports Medicine  [facility] [address] [place] [address] Phone: [phone] Fax:  [phone]   

## 2014-03-02 NOTE — Patient Instructions (Signed)
REFERRALS TO SPECIALISTS, SPECIAL TESTS (MRI, CT, ULTRASOUNDS)  GO THE WAITING ROOM AND TELL CHECK IN YOU NEED HELP WITH A REFERRAL. Either [facility] or [facility]DA will help you set it up.  If it is between 1-2 PM they may be at lunch.  After 5 PM, they will likely be at home.  They will call you, so please make sure the office has your correct phone number.  Referrals sometimes can be done same day if urgent, but others can take 2 or 3 days to get an appointment. Starting in 2015, many of the new Medicare insurance plans and Affordable Care Act (Obamacare) Health plans offered take much longer for referrals. They have added additional paperwork and steps.  MRI's and CT's can take up to a week for the test. (Emergencies like strokes take precedence. I will tell you if you have an emergency.)   If your referral is to an in-network [facility] office, their office may contact you directly prior to our office reaching you.  -- Examples: [facility], [facility], [facility], [facility], [facility], and many more.  Specialist appointment times vary a great deal, mostly on the specialist's schedule and if they have openings. -- Our office tries to get you in as fast as possible. -- Some specialists have very long wait times. (Example. Dermatology. Usually months) -- If you have a true emergency like new cancer, we work to get you in ASAP.   

## 2014-03-02 NOTE — Progress Notes (Signed)
Pre visit review using our clinic review tool, if applicable. No additional management support is needed unless otherwise documented below in the visit note. 

## 2014-03-06 ENCOUNTER — Telehealth: Payer: Self-pay

## 2014-03-06 ENCOUNTER — Telehealth: Payer: Self-pay | Admitting: *Deleted

## 2014-03-06 NOTE — Telephone Encounter (Signed)
He can take 2 vicodin up to every 6 hours as needed. This is a high dose.

## 2014-03-06 NOTE — Telephone Encounter (Signed)
Pt was seen 03/02/14 and pt is still struggling with back pain; pt wants to know if can get stronger hydrocodone apap rx. Pt request cb.

## 2014-03-06 NOTE — Telephone Encounter (Signed)
Pt s wife called and pt was sent home from work, cannot stand up straight,pt is aware MRI scheduled for 03/11/14 at 1:15 pm (that was first available appt per [facility]). Pt said he cannot take Lyrica; after taking Lyrica pt was jittery and bumps on neck. Pt stopped Lyrica. Pt said flexeril is not helping pain. Advised pt of Dr Copland's instructions but pt is begging for stronger pain med. Pt request cb.

## 2014-03-06 NOTE — Telephone Encounter (Signed)
i gave him vicodin, flexeril, lyrica, and xanax at last office visit.  Did markedly poorly with prednisone.  Basic answer, no.  He has 4 different agents + NSAIDS to combat pain.   We need to get his MRI ASAP, then determine plan of care.

## 2014-03-06 NOTE — Telephone Encounter (Signed)
Left message for Miguel Jimenez as instructed by Dr. Patsy Lageropland.  [facility] get [facility] scheduled AS[facility].

## 2014-03-07 NOTE — Telephone Encounter (Signed)
Left message for patient to return my call.

## 2014-03-07 NOTE — Telephone Encounter (Signed)
Lyda PeroneKirk called and left another voicemail.  I returned his call to let him know that Dr. Patsy Lageropland will take care of his pain medication but not until Wednesday when he is back in clinic.  [facility] has already contacted him with his appointment.  Wants Dr. Patsy Lageropland to know that he appreciates everything he is doing for him.

## 2014-03-07 NOTE — Telephone Encounter (Signed)
All noted. He really needs to have MRI, additional eval. Of note, patient missed his f/u and then urgently needed eval a few weeks after that date.   MRI pending Thursday.  Electronically Signed  By: Hannah BeatSpencer Tison Leibold, MD On: 03/07/2014 2:21 PM

## 2014-03-07 NOTE — Telephone Encounter (Signed)
Spoke with Miguel Jimenez.  He states he has been doubling up on his Vicodin since he saw us 03/02/2014 without any relief.  States he missed a step on Friday after seeing us and that sent him straight to the pavement.  Miguel Jimenez also states he has taken Perocet in the past and that seemed to help with his pain more that the Vicodin does and if he takes a lot of the Vicodin it makes his stomach hurts.   Advised Dr. Patsy Lageropland would not be back in the office until Wednesday but I would relay this information to him.

## 2014-03-07 NOTE — Telephone Encounter (Signed)
Spoke with Thayer Ohmhris at [facility], faxed records to [phone], they will schedule pt with Dr. Gerlene FeeKritzer on 03/13/14. / lt

## 2014-03-07 NOTE — Telephone Encounter (Signed)
I called [facility] myself directly, and they will be able to see the patient on Monday to evaluate him. They [facility] be [facility] no problem.   They will contact the patient directly to arrange his appointment time.   Will take care of pain medication in the morning.

## 2014-03-07 NOTE — Telephone Encounter (Signed)
Miguel Jimenez called back and left a voicemail asking if Dr. Patsy Lageropland will not increase his dose will he refill the Vicodin 5-325 mg with instructions to take two every 6 hours.  He states he is down to 2 pills.

## 2014-03-08 ENCOUNTER — Other Ambulatory Visit: Payer: Self-pay | Admitting: Family Medicine

## 2014-03-08 DIAGNOSIS — M5126 Other intervertebral disc displacement, lumbar region: Secondary | ICD-10-CM

## 2014-03-08 MED ORDER — OXYCODONE-ACETAMINOPHEN 5-325 MG PO TABS: 1.0000 | ORAL_TABLET | Freq: Four times a day (QID) | ORAL | Status: DC | PRN

## 2014-03-08 NOTE — Telephone Encounter (Signed)
NSG 03/13/2014. Percocet for now.  Electronically Signed  By: Hannah BeatSpencer Eliab Closson, MD On: 03/08/2014 8:10 AM

## 2014-03-08 NOTE — Addendum Note (Signed)
Addended by: Hannah BeatOPLAND, Raia Amico on: 03/08/2014 08:11 AM   Modules accepted: Orders, Medications

## 2014-03-10 ENCOUNTER — Ambulatory Visit
Admission: RE | Admit: 2014-03-10 | Discharge: 2014-03-10 | Disposition: A | Discharge status: Home / Self Care | Payer: PRIVATE HEALTH INSURANCE | Source: Ambulatory Visit | Attending: Family Medicine | Admitting: Family Medicine

## 2014-03-10 DIAGNOSIS — M5126 Other intervertebral disc displacement, lumbar region: Secondary | ICD-10-CM

## 2014-03-11 ENCOUNTER — Ambulatory Visit
Admission: RE | Admit: 2014-03-11 | Discharge: 2014-03-11 | Disposition: A | Discharge status: Home / Self Care | Payer: PRIVATE HEALTH INSURANCE | Source: Ambulatory Visit | Attending: Family Medicine | Admitting: Family Medicine

## 2014-03-11 DIAGNOSIS — M5116 Intervertebral disc disorders with radiculopathy, lumbar region: Secondary | ICD-10-CM

## 2014-03-11 DIAGNOSIS — M5416 Radiculopathy, lumbar region: Secondary | ICD-10-CM

## 2014-03-13 ENCOUNTER — Telehealth: Payer: Self-pay | Admitting: *Deleted

## 2014-03-13 MED ORDER — PROMETHAZINE HCL 25 MG PO TABS: 25.0000 mg | ORAL_TABLET | Freq: Three times a day (TID) | ORAL | Status: DC | PRN

## 2014-03-13 NOTE — Telephone Encounter (Signed)
Miguel Jimenez (wife) left voicemail requesting Dr. Patsy Lageropland call in a medication for [name]'s stomach.  States the pain medicine is making him sick on his stomach.  Asking if prescription for phenergan can be called in.  States Zofran is too expensive. Kena notified prescription has been sent to to [facility] [address]

## 2014-03-20 ENCOUNTER — Telehealth: Payer: Self-pay | Admitting: *Deleted

## 2014-03-20 NOTE — Telephone Encounter (Signed)
Spoke with Miguel Jimenez and advised him to contact the neurosurgeon for pain medications.

## 2014-03-20 NOTE — Telephone Encounter (Signed)
Miguel Jimenez left voicemail asking for refill on his pain medication and muscle relaxant.  He is not schedule for his spine injection until May 4.  Per Dr. Patsy Lageropland, Miguel Jimenez needs to contact Neurosurgeon for pain medications.

## 2014-04-27 ENCOUNTER — Other Ambulatory Visit: Payer: Self-pay | Admitting: Family Medicine

## 2014-04-28 NOTE — Telephone Encounter (Signed)
Called to [facility] [address] [place].

## 2014-04-28 NOTE — Telephone Encounter (Signed)
Last office visit 03/02/2014.  Last refilled 03/02/2014 for #90 with 1 refill.  Ok to refill?

## 2014-04-28 NOTE — Telephone Encounter (Signed)
Ok to refill #90, 1 refill 

## 2014-05-11 ENCOUNTER — Encounter: Payer: Self-pay | Admitting: Family Medicine

## 2014-05-11 ENCOUNTER — Ambulatory Visit (INDEPENDENT_AMBULATORY_CARE_PROVIDER_SITE_OTHER): Payer: PRIVATE HEALTH INSURANCE | Admitting: Family Medicine

## 2014-05-11 VITALS — BP 114/80 | HR 65 | Temp 98.7°F | Ht 71.5 in | Wt 204.5 lb

## 2014-05-11 DIAGNOSIS — M5126 Other intervertebral disc displacement, lumbar region: Secondary | ICD-10-CM

## 2014-05-11 DIAGNOSIS — M5116 Intervertebral disc disorders with radiculopathy, lumbar region: Secondary | ICD-10-CM | POA: Insufficient documentation

## 2014-05-11 DIAGNOSIS — F411 Generalized anxiety disorder: Secondary | ICD-10-CM

## 2014-05-11 MED ORDER — PREGABALIN 75 MG PO CAPS: 75.0000 mg | ORAL_CAPSULE | Freq: Two times a day (BID) | ORAL | Status: DC

## 2014-05-11 MED ORDER — ALPRAZOLAM 0.5 MG PO TABS: ORAL_TABLET | ORAL | Status: DC

## 2014-05-11 NOTE — Patient Instructions (Signed)
Lyrica 50 mg, 1 by mouth twice a day for 2 weeks,   Then increase to 75 mg, 1 by mouth twice a day.

## 2014-05-11 NOTE — Progress Notes (Signed)
Pre visit review using our clinic review tool, if applicable. No additional management support is needed unless otherwise documented below in the visit note. 

## 2014-05-11 NOTE — Progress Notes (Signed)
[address] [place] [address] Phone: [phone] Fax: [phone]  Patient ID: Miguel Jimenez MRN: [id number], DOB: 1965-12-13, 48 y.o. Date of Encounter: 05/11/2014  Primary Physician:  Hannah Beat, MD   Chief Complaint: Medication Management and Sciatica   Subjective:   History of Present Illness:  [name] is a 48 y.o. very pleasant male patient who presents with the following:  LBP doing better. Seeing NSG. LBP and radiculopathy and pain down his R leg.  Lateral and all the way down. Strength deficit gone S/p ESI x 2  Taking some xanax some during the day.   Lyrica - 45 dollars  Would need card.   Take the 0.25 mg xanax and will get more anxious. Opportunity. Leg hurting at night.   Day in and day out.    Past Medical History, Surgical History, Social History, Family History, Problem List, Medications, and Allergies have been reviewed and updated if relevant.  Review of Systems:  GEN: No acute illnesses, no fevers, chills. GI: No n/v/d, eating normally Pulm: No SOB Interactive and getting along well at home.  Otherwise, ROS is as per the HPI.  Objective:   Physical Examination: BP 114/80  Pulse 65  Temp(Src) 98.7 F (37.1 C) (Oral)  Ht 5' 11.5" (1.816 m)  Wt 204 lb 8 oz (92.761 kg)  BMI 28.13 kg/m2   GEN: WDWN, NAD, Non-toxic, A & O x 3 HEENT: Atraumatic, Normocephalic. Neck supple. No masses, No LAD. Ears and Nose: No external deformity. CV: RRR, No M/G/R. No JVD. No thrill. No extra heart sounds. PULM: CTA B, no wheezes, crackles, rhonchi. No retractions. No resp. distress. No accessory muscle use. EXTR: No c/c/e NEURO Normal gait.  PSYCH: Normally interactive. Conversant. Not depressed or anxious appearing.  Calm demeanor.   Laboratory and Imaging Data:  Assessment & Plan:   Lumbar disc herniation with radiculopathy  Generalized anxiety disorder  >25 minutes spent in face to face time with patient, >50% spent in  counselling or coordination of care: essentially the entirety of the office visit was in counseling. The patient had multiple questions about medications that can be used with lumbar radiculopathy. He is seeing neurosurgery right now, and has had 2 epidural steroid injections with some success. He'll is highly anxious about starting any neuropathic pain medications before. He brings in a list of all possible side effects that he pulled off of the Internet. I reviewed Lyrica, gabapentin, Cymbalta, Sabella, as well as Tri-Cyclic antidepressants with the patient. All have a fairly large amount of data. He is agreeable to starting some Lyrica, so we will start out at 50 mg twice a day.  He also is a highly anxious individual. I tried to put him on antidepressants multiple times previously, and he has never started them, or he is taking only a day. He is having some breakthrough anxiety, and I think increasing his Xanax dose to 0.5 mg t.i.d. Would be reasonable. He is sleeping better using melatonin only.  New Prescriptions   No medications on file   Modified Medications   Modified Medication Previous Medication   ALPRAZOLAM (XANAX) 0.5 MG TABLET ALPRAZolam (XANAX) 0.25 MG tablet      TAKE ONE TABLET BY MOUTH THREE TIMES DAILY AS NEEDED FOR ANXIETY    TAKE ONE TABLET BY MOUTH THREE TIMES DAILY AS NEEDED FOR ANXIETY   PREGABALIN (LYRICA) 75 MG CAPSULE pregabalin (LYRICA) 75 MG capsule      Take 1 capsule (75 mg total)  by mouth 2 (two) times daily.    Take 1 capsule (75 mg total) by mouth 2 (two) times daily.   Patient Instructions  Lyrica 50 mg, 1 by mouth twice a day for 2 weeks,   Then increase to 75 mg, 1 by mouth twice a day.     Follow-up: No Follow-up on file. Unless noted above, the patient is to follow-up if symptoms worsen. Red flags were reviewed with the patient.  Signed,  Elpidio GaleaSpencer T. Nicholad Kautzman, MD, CAQ Sports Medicine   Discontinued Medications   CYCLOBENZAPRINE (FLEXERIL) 10 MG  TABLET    Take 1 tablet (10 mg total) by mouth at bedtime.   NAPROXEN SODIUM (ALEVE) 220 MG TABLET    Take 220 mg by mouth 2 (two) times daily with a meal.   Current Medications at Discharge:   Medication List       This list is accurate as of: 05/11/14 11:59 PM.  Always use your most recent med list.               ALPRAZolam 0.5 MG tablet  Commonly known as:  XANAX  TAKE ONE TABLET BY MOUTH THREE TIMES DAILY AS NEEDED FOR ANXIETY     ibuprofen 200 MG tablet  Commonly known as:  ADVIL,MOTRIN  Take 400-600 mg by mouth every 6 (six) hours as needed.     oxyCODONE-acetaminophen 5-325 MG per tablet  Commonly known as:  PERCOCET/ROXICET  Take 1 tablet by mouth every 6 (six) hours as needed for moderate pain or severe pain.     pregabalin 75 MG capsule  Commonly known as:  LYRICA  Take 1 capsule (75 mg total) by mouth 2 (two) times daily.     promethazine 25 MG tablet  Commonly known as:  PHENERGAN  Take 1 tablet (25 mg total) by mouth every 8 (eight) hours as needed for nausea or vomiting.

## 2014-07-06 ENCOUNTER — Ambulatory Visit (INDEPENDENT_AMBULATORY_CARE_PROVIDER_SITE_OTHER): Payer: PRIVATE HEALTH INSURANCE | Admitting: Family Medicine

## 2014-07-06 ENCOUNTER — Encounter: Payer: Self-pay | Admitting: Family Medicine

## 2014-07-06 VITALS — BP 106/80 | HR 69 | Temp 97.8°F | Ht 71.5 in | Wt 203.5 lb

## 2014-07-06 DIAGNOSIS — L0292 Furuncle, unspecified: Secondary | ICD-10-CM

## 2014-07-06 DIAGNOSIS — L0293 Carbuncle, unspecified: Secondary | ICD-10-CM

## 2014-07-06 DIAGNOSIS — B029 Zoster without complications: Secondary | ICD-10-CM

## 2014-07-06 MED ORDER — VALACYCLOVIR HCL 1 G PO TABS: 1000.0000 mg | ORAL_TABLET | Freq: Three times a day (TID) | ORAL | Status: DC

## 2014-07-06 MED ORDER — TRIAMCINOLONE ACETONIDE 0.1 % EX CREA: 1.0000 "application " | TOPICAL_CREAM | Freq: Two times a day (BID) | CUTANEOUS | Status: DC

## 2014-07-06 MED ORDER — SULFAMETHOXAZOLE-TMP DS 800-160 MG PO TABS: 2.0000 | ORAL_TABLET | Freq: Two times a day (BID) | ORAL | Status: DC

## 2014-07-06 NOTE — Progress Notes (Signed)
[address] [place] [address]                Phone: [phone]                  Fax: [phone] 07/06/2014  ID: Edilberto Roosevelt   MRN: [id number]  DOB: 07/11/1966  Primary Physician:  Hannah Beat, MD  Chief Complaint: Rash  Subjective:   History of Present Illness:  Miguel Jimenez is a 48 y.o. very pleasant male patient who presents with the following:  The patient presents after having approximately one-month history of some painful vesicular rash on his back. He is here with his wife who collaborate his story. He has not had any other type of topical exposure. They have been quite limiting, and painful. He equates that to the level of pain he had with his q.d. Disc herniation earlier in the year. His back pain is actually remarkably better.  He is in severe pain, slept only approximately 1-2 hours in the last 3 days. To him, and they have gotten worse the last week. He also has popped a bunch of them, and his wife his pump pocket and additionally after using a needle that was sterilized.  Has not slept more than 1-2 hours in 3 days.   Shingles, ? Infection on top of it.  Past Medical History, Surgical History, Social History, Family History, Problem List, Medications, and Allergies have been reviewed and updated if relevant.  Review of Systems:  GEN: No acute illnesses, no fevers, chills. GI: No n/v/d, eating normally Pulm: No SOB Interactive and getting along well at home.  Otherwise, ROS is as per the HPI.  Objective:   Physical Examination: BP 106/80  Pulse 69  Temp(Src) 97.8 F (36.6 C) (Oral)  Ht 5' 11.5" (1.816 m)  Wt 203 lb 8 oz (92.307 kg)  BMI 27.99 kg/m2   GEN: WDWN, NAD, Non-toxic, A & O x 3 HEENT: Atraumatic, Normocephalic. Neck supple. No masses, No LAD. Ears and Nose: No external deformity. CV: RRR, No M/G/R. No JVD. No thrill. No extra heart sounds. PULM: CTA B, no wheezes, crackles, rhonchi. No retractions. No resp.  distress. No accessory muscle use. EXTR: No c/c/e NEURO Normal gait.  PSYCH: anxious appearing  All the posterior aspect of the patient's back on the patient does have some elevated regions and vesicles, with various stages of healing. They're somewhat slightly enlarged base and redness with some comedo type appearance. He also has one area on the RIGHT side of his face.  Laboratory and Imaging Data:  Assessment & Plan:   Herpes zoster  Recurrent boils  Probable zoster with probable superinfection after scratching and popping the vesicles. This is a more unusual case with the large expansive area. I'm going to go into place him on Valtrex. He artery has some Lyrica at home, and so I'm going to have him take some Lyrica at nighttime.  Also gave instrument triamcinolone cream to use on some of the areas to help with some of the acute irritation ENT.  I suspect a superinfection with some boils or plain a role in making this worse, son to place him on some MRSA sensitive antibiotics.  New Prescriptions   SULFAMETHOXAZOLE-TRIMETHOPRIM (BACTRIM DS) 800-160 MG PER TABLET    Take 2 tablets by mouth 2 (two) times daily.   TRIAMCINOLONE  CREAM (KENALOG) 0.1 %    Apply 1 application topically 2 (two) times daily.   VALACYCLOVIR (VALTREX) 1000 MG TABLET    Take 1 tablet (1,000 mg total) by mouth 3 (three) times daily.   Discontinued Medications   OXYCODONE-ACETAMINOPHEN (PERCOCET/ROXICET) 5-325 MG PER TABLET    Take 1 tablet by mouth every 6 (six) hours as needed for moderate pain or severe pain.   PROMETHAZINE (PHENERGAN) 25 MG TABLET    Take 1 tablet (25 mg total) by mouth every 8 (eight) hours as needed for nausea or vomiting.   Modified Medications   No medications on file   No orders of the defined types were placed in this encounter.   Follow-up: No Follow-up on file. Unless noted above, the patient is to follow-up if symptoms worsen. Red flags were reviewed with the  patient.  Signed,  Elpidio GaleaSpencer T. Saagar Tortorella, MD, CAQ Sports Medicine  Current Medications at Discharge:   Medication List       This list is accurate as of: 07/06/14 11:59 PM.  Always use your most recent med list.               ALPRAZolam 0.5 MG tablet  Commonly known as:  XANAX  TAKE ONE TABLET BY MOUTH THREE TIMES DAILY AS NEEDED FOR ANXIETY     ibuprofen 200 MG tablet  Commonly known as:  ADVIL,MOTRIN  Take 400-600 mg by mouth every 6 (six) hours as needed.     pregabalin 75 MG capsule  Commonly known as:  LYRICA  Take 1 capsule (75 mg total) by mouth 2 (two) times daily.     sulfamethoxazole-trimethoprim 800-160 MG per tablet  Commonly known as:  BACTRIM DS  Take 2 tablets by mouth 2 (two) times daily.     triamcinolone cream 0.1 %  Commonly known as:  KENALOG  Apply 1 application topically 2 (two) times daily.     valACYclovir 1000 MG tablet  Commonly known as:  VALTREX  Take 1 tablet (1,000 mg total) by mouth 3 (three) times daily.

## 2014-07-06 NOTE — Progress Notes (Signed)
Pre visit review using our clinic review tool, if applicable. No additional management support is needed unless otherwise documented below in the visit note. 

## 2014-07-06 NOTE — Patient Instructions (Signed)
Has my daughter had Varicella (Chicken Pox) Vaccine?

## 2014-08-12 ENCOUNTER — Emergency Department: Payer: Self-pay | Admitting: Student

## 2014-08-12 LAB — URINALYSIS, COMPLETE
BACTERIA: NONE SEEN
BILIRUBIN, UR: NEGATIVE
BLOOD: NEGATIVE
Glucose,UR: NEGATIVE mg/dL (ref 0–75)
Leukocyte Esterase: NEGATIVE
NITRITE: NEGATIVE
PH: 6 (ref 4.5–8.0)
PROTEIN: NEGATIVE
RBC,UR: 1 /HPF (ref 0–5)
SPECIFIC GRAVITY: 1.008 (ref 1.003–1.030)
SQUAMOUS EPITHELIAL: NONE SEEN

## 2014-08-12 LAB — CBC WITH DIFFERENTIAL/PLATELET
BASOS ABS: 0.1 10*3/uL (ref 0.0–0.1)
Basophil %: 0.3 %
EOS ABS: 0.6 10*3/uL (ref 0.0–0.7)
Eosinophil %: 3.4 %
HCT: 46.2 % (ref 40.0–52.0)
HGB: 15.4 g/dL (ref 13.0–18.0)
LYMPHS ABS: 2 10*3/uL (ref 1.0–3.6)
LYMPHS PCT: 11.6 %
MCH: 34.3 pg — AB (ref 26.0–34.0)
MCHC: 33.5 g/dL (ref 32.0–36.0)
MCV: 102 fL — AB (ref 80–100)
MONOS PCT: 10.2 %
Monocyte #: 1.7 x10 3/mm — ABNORMAL HIGH (ref 0.2–1.0)
NEUTROS PCT: 74.5 %
Neutrophil #: 12.6 10*3/uL — ABNORMAL HIGH (ref 1.4–6.5)
Platelet: 240 10*3/uL (ref 150–440)
RBC: 4.51 10*6/uL (ref 4.40–5.90)
RDW: 14.2 % (ref 11.5–14.5)
WBC: 16.9 10*3/uL — ABNORMAL HIGH (ref 3.8–10.6)

## 2014-08-12 LAB — COMPREHENSIVE METABOLIC PANEL
ALBUMIN: 4.2 g/dL (ref 3.4–5.0)
ALT: 41 U/L
ANION GAP: 8 (ref 7–16)
AST: 23 U/L (ref 15–37)
Alkaline Phosphatase: 107 U/L
BUN: 12 mg/dL (ref 7–18)
Bilirubin,Total: 1.2 mg/dL — ABNORMAL HIGH (ref 0.2–1.0)
CHLORIDE: 99 mmol/L (ref 98–107)
Calcium, Total: 8.9 mg/dL (ref 8.5–10.1)
Co2: 30 mmol/L (ref 21–32)
Creatinine: 1.15 mg/dL (ref 0.60–1.30)
EGFR (Non-African Amer.): >60
GLUCOSE: 118 mg/dL — AB (ref 65–99)
Osmolality: 275 (ref 275–301)
POTASSIUM: 3.8 mmol/L (ref 3.5–5.1)
Sodium: 137 mmol/L (ref 136–145)
TOTAL PROTEIN: 7.8 g/dL (ref 6.4–8.2)

## 2014-08-12 LAB — ETHANOL: Ethanol: <3 mg/dL

## 2014-08-13 ENCOUNTER — Emergency Department: Payer: Self-pay | Admitting: Emergency Medicine

## 2014-08-15 ENCOUNTER — Telehealth: Payer: Self-pay | Admitting: Family Medicine

## 2014-08-15 NOTE — Telephone Encounter (Signed)
Noted  
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
## 2014-08-16 ENCOUNTER — Encounter (HOSPITAL_COMMUNITY): Payer: Self-pay | Admitting: Emergency Medicine

## 2014-08-16 ENCOUNTER — Emergency Department (HOSPITAL_COMMUNITY)
Admission: EM | Admit: 2014-08-16 | Discharge: 2014-08-16 | Disposition: A | Discharge status: Home / Self Care | Payer: PRIVATE HEALTH INSURANCE | Attending: Emergency Medicine | Admitting: Emergency Medicine

## 2014-08-16 ENCOUNTER — Ambulatory Visit: Payer: PRIVATE HEALTH INSURANCE | Admitting: Family Medicine

## 2014-08-16 DIAGNOSIS — F17200 Nicotine dependence, unspecified, uncomplicated: Secondary | ICD-10-CM | POA: Diagnosis not present

## 2014-08-16 DIAGNOSIS — Z87828 Personal history of other (healed) physical injury and trauma: Secondary | ICD-10-CM | POA: Insufficient documentation

## 2014-08-16 DIAGNOSIS — G43909 Migraine, unspecified, not intractable, without status migrainosus: Secondary | ICD-10-CM | POA: Diagnosis not present

## 2014-08-16 DIAGNOSIS — F0781 Postconcussional syndrome: Secondary | ICD-10-CM | POA: Diagnosis not present

## 2014-08-16 DIAGNOSIS — F411 Generalized anxiety disorder: Secondary | ICD-10-CM | POA: Diagnosis not present

## 2014-08-16 DIAGNOSIS — R112 Nausea with vomiting, unspecified: Secondary | ICD-10-CM | POA: Diagnosis not present

## 2014-08-16 DIAGNOSIS — Z792 Long term (current) use of antibiotics: Secondary | ICD-10-CM | POA: Diagnosis not present

## 2014-08-16 DIAGNOSIS — R51 Headache: Secondary | ICD-10-CM | POA: Insufficient documentation

## 2014-08-16 DIAGNOSIS — Z79899 Other long term (current) drug therapy: Secondary | ICD-10-CM | POA: Insufficient documentation

## 2014-08-16 LAB — CBC WITH DIFFERENTIAL/PLATELET
Basophils Absolute: 0 10*3/uL (ref 0.0–0.1)
Basophils Relative: 0 % (ref 0–1)
Eosinophils Absolute: 0.3 10*3/uL (ref 0.0–0.7)
Eosinophils Relative: 5 % (ref 0–5)
HCT: 44.2 % (ref 39.0–52.0)
Hemoglobin: 15.4 g/dL (ref 13.0–17.0)
Lymphocytes Relative: 20 % (ref 12–46)
Lymphs Abs: 1.4 10*3/uL (ref 0.7–4.0)
MCH: 34.7 pg — ABNORMAL HIGH (ref 26.0–34.0)
MCHC: 34.8 g/dL (ref 30.0–36.0)
MCV: 99.5 fL (ref 78.0–100.0)
Monocytes Absolute: 0.7 10*3/uL (ref 0.1–1.0)
Monocytes Relative: 9 % (ref 3–12)
Neutro Abs: 4.6 10*3/uL (ref 1.7–7.7)
Neutrophils Relative %: 66 % (ref 43–77)
Platelets: 234 10*3/uL (ref 150–400)
RBC: 4.44 MIL/uL (ref 4.22–5.81)
RDW: 13.5 % (ref 11.5–15.5)
WBC: 7 10*3/uL (ref 4.0–10.5)

## 2014-08-16 LAB — BASIC METABOLIC PANEL
Anion gap: 8 (ref 5–15)
BUN: 8 mg/dL (ref 6–23)
CO2: 28 mEq/L (ref 19–32)
Calcium: 9.1 mg/dL (ref 8.4–10.5)
Chloride: 100 mEq/L (ref 96–112)
Creatinine, Ser: 0.99 mg/dL (ref 0.50–1.35)
GFR calc Af Amer: >90 mL/min (ref >90)
GFR calc non Af Amer: >90 mL/min (ref >90)
Glucose, Bld: 99 mg/dL (ref 70–99)
Potassium: 4.2 mEq/L (ref 3.7–5.3)
Sodium: 136 mEq/L — ABNORMAL LOW (ref 137–147)

## 2014-08-16 MED ORDER — OXYCODONE-ACETAMINOPHEN 5-325 MG PO TABS: 1.0000 | ORAL_TABLET | ORAL | Status: DC | PRN

## 2014-08-16 MED ORDER — SODIUM CHLORIDE 0.9 % IV BOLUS (SEPSIS)
1000.0000 mL | Freq: Once | INTRAVENOUS | Status: AC
  Administered 2014-08-16: 1000 mL via INTRAVENOUS

## 2014-08-16 MED ORDER — ONDANSETRON HCL 4 MG/2ML IJ SOLN
4.0000 mg | Freq: Once | INTRAMUSCULAR | Status: AC
  Administered 2014-08-16: 4 mg via INTRAVENOUS
  Filled 2014-08-16: qty 2

## 2014-08-16 MED ORDER — OXYCODONE-ACETAMINOPHEN 5-325 MG PO TABS
2.0000 | ORAL_TABLET | Freq: Once | ORAL | Status: AC
  Administered 2014-08-16: 2 via ORAL
  Filled 2014-08-16: qty 2

## 2014-08-16 MED ORDER — PROMETHAZINE HCL 25 MG PO TABS: 25.0000 mg | ORAL_TABLET | Freq: Four times a day (QID) | ORAL | Status: DC | PRN
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
## 2014-08-16 NOTE — Discharge Instructions (Signed)
Concussion  A concussion, or closed-head injury, is a brain injury caused by a direct blow to the head or by a quick and sudden movement (jolt) of the head or neck. Concussions are usually not life-threatening. Even so, the effects of a concussion can be serious. If you have had a concussion before, you are more likely to experience concussion-like symptoms after a direct blow to the head.   CAUSES  · Direct blow to the head, such as from running into another player during a soccer game, being hit in a fight, or hitting your head on a hard surface.  · A jolt of the head or neck that causes the brain to move back and forth inside the skull, such as in a car crash.  SIGNS AND SYMPTOMS  The signs of a concussion can be hard to notice. Early on, they may be missed by you, family members, and health care providers. You may look fine but act or feel differently.  Symptoms are usually temporary, but they may last for days, weeks, or even longer. Some symptoms may appear right away while others may not show up for hours or days. Every head injury is different. Symptoms include:  · Mild to moderate headaches that will not go away.  · A feeling of pressure inside your head.  · Having more trouble than usual:  ¨ Learning or remembering things you have heard.  ¨ Answering questions.  ¨ Paying attention or concentrating.  ¨ Organizing daily tasks.  ¨ Making decisions and solving problems.  · Slowness in thinking, acting or reacting, speaking, or reading.  · Getting lost or being easily confused.  · Feeling tired all the time or lacking energy (fatigued).  · Feeling drowsy.  · Sleep disturbances.  ¨ Sleeping more than usual.  ¨ Sleeping less than usual.  ¨ Trouble falling asleep.  ¨ Trouble sleeping (insomnia).  · Loss of balance or feeling lightheaded or dizzy.  · Nausea or vomiting.  · Numbness or tingling.  · Increased sensitivity to:  ¨ Sounds.  ¨ Lights.  ¨ Distractions.  · Vision problems or eyes that tire  easily.  · Diminished sense of taste or smell.  · Ringing in the ears.  · Mood changes such as feeling sad or anxious.  · Becoming easily irritated or angry for little or no reason.  · Lack of motivation.  · Seeing or hearing things other people do not see or hear (hallucinations).  DIAGNOSIS  Your health care provider can usually diagnose a concussion based on a description of your injury and symptoms. He or she will ask whether you passed out (lost consciousness) and whether you are having trouble remembering events that happened right before and during your injury.  Your evaluation might include:  · A brain scan to look for signs of injury to the brain. Even if the test shows no injury, you may still have a concussion.  · Blood tests to be sure other problems are not present.  TREATMENT  · Concussions are usually treated in an emergency department, in urgent care, or at a clinic. You may need to stay in the hospital overnight for further treatment.  · Tell your health care provider if you are taking any medicines, including prescription medicines, over-the-counter medicines, and natural remedies. Some medicines, such as blood thinners (anticoagulants) and aspirin, may increase the chance of complications. Also tell your health care provider whether you have had alcohol or are taking illegal drugs. This information   may affect treatment.  · Your health care provider will send you home with important instructions to follow.  · How fast you will recover from a concussion depends on many factors. These factors include how severe your concussion is, what part of your brain was injured, your age, and how healthy you were before the concussion.  · Most people with mild injuries recover fully. Recovery can take time. In general, recovery is slower in older persons. Also, persons who have had a concussion in the past or have other medical problems may find that it takes longer to recover from their current injury.  HOME  CARE INSTRUCTIONS  General Instructions  · Carefully follow the directions your health care provider gave you.  · Only take over-the-counter or prescription medicines for pain, discomfort, or fever as directed by your health care provider.  · Take only those medicines that your health care provider has approved.  · Do not drink alcohol until your health care provider says you are well enough to do so. Alcohol and certain other drugs may slow your recovery and can put you at risk of further injury.  · If it is harder than usual to remember things, write them down.  · If you are easily distracted, try to do one thing at a time. For example, do not try to watch TV while fixing dinner.  · Talk with family members or close friends when making important decisions.  · Keep all follow-up appointments. Repeated evaluation of your symptoms is recommended for your recovery.  · Watch your symptoms and tell others to do the same. Complications sometimes occur after a concussion. Older adults with a brain injury may have a higher risk of serious complications, such as a blood clot on the brain.  · Tell your teachers, school nurse, school counselor, coach, athletic trainer, or work manager about your injury, symptoms, and restrictions. Tell them about what you can or cannot do. They should watch for:  ¨ Increased problems with attention or concentration.  ¨ Increased difficulty remembering or learning new information.  ¨ Increased time needed to complete tasks or assignments.  ¨ Increased irritability or decreased ability to cope with stress.  ¨ Increased symptoms.  · Rest. Rest helps the brain to heal. Make sure you:  ¨ Get plenty of sleep at night. Avoid staying up late at night.  ¨ Keep the same bedtime hours on weekends and weekdays.  ¨ Rest during the day. Take daytime naps or rest breaks when you feel tired.  · Limit activities that require a lot of thought or concentration. These include:  ¨ Doing homework or job-related  work.  ¨ Watching TV.  ¨ Working on the computer.  · Avoid any situation where there is potential for another head injury (football, hockey, soccer, basketball, martial arts, downhill snow sports and horseback riding). Your condition will get worse every time you experience a concussion. You should avoid these activities until you are evaluated by the appropriate follow-up health care providers.  Returning To Your Regular Activities  You will need to return to your normal activities slowly, not all at once. You must give your body and brain enough time for recovery.  · Do not return to sports or other athletic activities until your health care provider tells you it is safe to do so.  · Ask your health care provider when you can drive, ride a bicycle, or operate heavy machinery. Your ability to react may be slower after a   brain injury. Never do these activities if you are dizzy.  · Ask your health care provider about when you can return to work or school.  Preventing Another Concussion  It is very important to avoid another brain injury, especially before you have recovered. In rare cases, another injury can lead to permanent brain damage, brain swelling, or death. The risk of this is greatest during the first 7-10 days after a head injury. Avoid injuries by:  · Wearing a seat belt when riding in a car.  · Drinking alcohol only in moderation.  · Wearing a helmet when biking, skiing, skateboarding, skating, or doing similar activities.  · Avoiding activities that could lead to a second concussion, such as contact or recreational sports, until your health care provider says it is okay.  · Taking safety measures in your home.  ¨ Remove clutter and tripping hazards from floors and stairways.  ¨ Use grab bars in bathrooms and handrails by stairs.  ¨ Place non-slip mats on floors and in bathtubs.  ¨ Improve lighting in dim areas.  SEEK MEDICAL CARE IF:  · You have increased problems paying attention or  concentrating.  · You have increased difficulty remembering or learning new information.  · You need more time to complete tasks or assignments than before.  · You have increased irritability or decreased ability to cope with stress.  · You have more symptoms than before.  Seek medical care if you have any of the following symptoms for more than 2 weeks after your injury:  · Lasting (chronic) headaches.  · Dizziness or balance problems.  · Nausea.  · Vision problems.  · Increased sensitivity to noise or light.  · Depression or mood swings.  · Anxiety or irritability.  · Memory problems.  · Difficulty concentrating or paying attention.  · Sleep problems.  · Feeling tired all the time.  SEEK IMMEDIATE MEDICAL CARE IF:  · You have severe or worsening headaches. These may be a sign of a blood clot in the brain.  · You have weakness (even if only in one hand, leg, or part of the face).  · You have numbness.  · You have decreased coordination.  · You vomit repeatedly.  · You have increased sleepiness.  · One pupil is larger than the other.  · You have convulsions.  · You have slurred speech.  · You have increased confusion. This may be a sign of a blood clot in the brain.  · You have increased restlessness, agitation, or irritability.  · You are unable to recognize people or places.  · You have neck pain.  · It is difficult to wake you up.  · You have unusual behavior changes.  · You lose consciousness.  MAKE SURE YOU:  · Understand these instructions.  · Will watch your condition.  · Will get help right away if you are not doing well or get worse.  Document Released: 02/07/2004 Document Revised: 11/22/2013 Document Reviewed: 06/09/2013  [employer]® Patient Information ©2015 [employer]. This information is not intended to replace advice given to you by your health care provider. Make sure you discuss any questions you have with your health care provider.

## 2014-08-16 NOTE — ED Provider Notes (Signed)
Medical screening examination/treatment/procedure(s) were performed by non-physician practitioner and as supervising physician I was immediately available for consultation/collaboration.   EKG Interpretation None       Ilian Wessell, MD 08/16/14 1711 
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
## 2014-08-17 ENCOUNTER — Encounter: Payer: Self-pay | Admitting: Family Medicine

## 2014-08-17 ENCOUNTER — Ambulatory Visit (INDEPENDENT_AMBULATORY_CARE_PROVIDER_SITE_OTHER): Payer: PRIVATE HEALTH INSURANCE | Admitting: Family Medicine

## 2014-08-17 VITALS — BP 92/70 | HR 92 | Temp 98.4°F | Ht 71.5 in | Wt 200.5 lb

## 2014-08-17 DIAGNOSIS — S022XXA Fracture of nasal bones, initial encounter for closed fracture: Secondary | ICD-10-CM

## 2014-08-17 DIAGNOSIS — H3322 Serous retinal detachment, left eye: Secondary | ICD-10-CM

## 2014-08-17 DIAGNOSIS — H3320 Serous retinal detachment, unspecified eye: Secondary | ICD-10-CM

## 2014-08-17 DIAGNOSIS — S060X0A Concussion without loss of consciousness, initial encounter: Secondary | ICD-10-CM

## 2014-08-17 DIAGNOSIS — S0230XA Fracture of orbital floor, unspecified side, initial encounter for closed fracture: Secondary | ICD-10-CM

## 2014-08-17 MED ORDER — OXYCODONE-ACETAMINOPHEN 5-325 MG PO TABS: 1.0000 | ORAL_TABLET | ORAL | Status: DC | PRN

## 2014-08-17 NOTE — Patient Instructions (Signed)
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
## 2014-08-17 NOTE — Progress Notes (Signed)
Pre visit review using our clinic review tool, if applicable. No additional management support is needed unless otherwise documented below in the visit note. 

## 2014-08-18 ENCOUNTER — Encounter: Payer: Self-pay | Admitting: Family Medicine

## 2014-08-18 ENCOUNTER — Telehealth: Payer: Self-pay

## 2014-08-18 DIAGNOSIS — H3320 Serous retinal detachment, unspecified eye: Secondary | ICD-10-CM | POA: Insufficient documentation

## 2014-08-18 DIAGNOSIS — S060X0A Concussion without loss of consciousness, initial encounter: Secondary | ICD-10-CM | POA: Insufficient documentation

## 2014-08-18 DIAGNOSIS — S0230XA Fracture of orbital floor, unspecified side, initial encounter for closed fracture: Secondary | ICD-10-CM | POA: Insufficient documentation

## 2014-08-18 HISTORY — DX: Concussion without loss of consciousness, initial encounter: S06.0X0A

## 2014-08-18 HISTORY — DX: Serous retinal detachment, unspecified eye: H33.20

## 2014-08-18 HISTORY — DX: Fracture of orbital floor, unspecified side, initial encounter for closed fracture: S02.30XA

## 2014-08-18 NOTE — Telephone Encounter (Signed)
Mrs Nyland left v/m;pt seen 08/17/14 and given percocet which was same med given to pt in ED' pt request stronger pain med; pt did not sleep last night due to facial pain. Please advise. Mrs Mitchum request cb.
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
## 2014-08-18 NOTE — Telephone Encounter (Signed)
Pt left v/m;pt hoping for stronger med; pt said percocet 5 mg taking 3-4 of them and pt is concerned about amount of acetaminophen in percocet. Pt request cb today.

## 2014-08-19 ENCOUNTER — Ambulatory Visit (INDEPENDENT_AMBULATORY_CARE_PROVIDER_SITE_OTHER): Payer: PRIVATE HEALTH INSURANCE | Admitting: Family Medicine

## 2014-08-19 VITALS — BP 120/90 | Temp 98.0°F | Wt 194.0 lb

## 2014-08-19 DIAGNOSIS — S060X0A Concussion without loss of consciousness, initial encounter: Secondary | ICD-10-CM

## 2014-08-19 DIAGNOSIS — Z5189 Encounter for other specified aftercare: Secondary | ICD-10-CM

## 2014-08-19 DIAGNOSIS — S060X0D Concussion without loss of consciousness, subsequent encounter: Secondary | ICD-10-CM

## 2014-08-19 DIAGNOSIS — S0230XA Fracture of orbital floor, unspecified side, initial encounter for closed fracture: Secondary | ICD-10-CM

## 2014-08-19 MED ORDER — OXYCODONE-ACETAMINOPHEN 10-325 MG PO TABS: 1.0000 | ORAL_TABLET | ORAL | Status: DC | PRN

## 2014-08-19 NOTE — Patient Instructions (Signed)
For better pain control - you can try 1-2 pills of the percocet 10-325 up to every 4 hours Take just as much as you need Watch out for sedation and constipation  Take a stool softener if you need it  Keep Dr Patsy Lager posted and see him this week as planned
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
## 2014-08-20 NOTE — Assessment & Plan Note (Signed)
Improvement in mental status and concentration  For f/u with PCP next week as planned Also much imp in headache and nausea (but not facial pain)

## 2014-08-20 NOTE — Telephone Encounter (Signed)
Noted, he saw Dr. Milinda Antis, notes reviewed.

## 2014-08-20 NOTE — Assessment & Plan Note (Signed)
Pt still in much pain - the percocet 5-325 is not handling it - in an effort to get more relief he did take too many pills and thus too much acetaminophen  Counseled on this  Some constipation- disc use of a stool softener now that he is eating  Will inc his dose to 10-325 with inst to take 1-2 pills q 4 hours prn - using only the amt he needs - #45 pills no ref Warned about sedation and other opiate side eff as well as habit potential and he voiced understanding   If inc ha or dizziness/ nausea inst to call and seek care in ER if necessary  F/u with PCP this week as planned  F/u with opth for his retinal tear as well

## 2014-08-23 ENCOUNTER — Other Ambulatory Visit: Payer: Self-pay

## 2014-08-23 MED ORDER — OXYCODONE-ACETAMINOPHEN 10-325 MG PO TABS: 1.0000 | ORAL_TABLET | ORAL | Status: DC | PRN

## 2014-08-23 NOTE — Telephone Encounter (Signed)
Pt left v/m requesting rx oxycodone apap. Call when ready for pick up. Pt request to pick up today.  Oxycodone apap filled on 08/19/14 #45. Pt has f/u appt with Dr Patsy Lager on 08/31/2014.Please advise.

## 2014-08-23 NOTE — Telephone Encounter (Signed)
Lyda Perone notified as instructed by telephone.  He states this will be his last refill.  Prescription left up front for patient to pick up.

## 2014-08-23 NOTE — Telephone Encounter (Signed)
Call:  He is taking too much pain medication. He needs to back off if he can. I recognize his trauma and pain, but taking too much increases risk of dependence / addiction a lot.   This should be enough to when I see him next week. Continue to rest.

## 2014-08-31 ENCOUNTER — Encounter: Payer: Self-pay | Admitting: Family Medicine

## 2014-08-31 ENCOUNTER — Ambulatory Visit (INDEPENDENT_AMBULATORY_CARE_PROVIDER_SITE_OTHER): Payer: PRIVATE HEALTH INSURANCE | Admitting: Family Medicine

## 2014-08-31 VITALS — BP 112/80 | HR 89 | Temp 98.4°F | Ht 71.5 in | Wt 202.5 lb

## 2014-08-31 DIAGNOSIS — S060X0D Concussion without loss of consciousness, subsequent encounter: Secondary | ICD-10-CM

## 2014-08-31 DIAGNOSIS — S0230XA Fracture of orbital floor, unspecified side, initial encounter for closed fracture: Secondary | ICD-10-CM

## 2014-08-31 DIAGNOSIS — S023XXA Fracture of orbital floor, initial encounter for closed fracture: Secondary | ICD-10-CM

## 2014-08-31 DIAGNOSIS — F0781 Postconcussional syndrome: Secondary | ICD-10-CM

## 2014-08-31 DIAGNOSIS — H3322 Serous retinal detachment, left eye: Secondary | ICD-10-CM

## 2014-08-31 DIAGNOSIS — H3570 Unspecified separation of retinal layers: Secondary | ICD-10-CM

## 2014-08-31 MED ORDER — ALPRAZOLAM 0.5 MG PO TABS: ORAL_TABLET | ORAL | Status: DC

## 2014-08-31 MED ORDER — AMITRIPTYLINE HCL 25 MG PO TABS: 25.0000 mg | ORAL_TABLET | Freq: Every day | ORAL | Status: DC

## 2014-08-31 MED ORDER — PROMETHAZINE HCL 25 MG PO TABS: 25.0000 mg | ORAL_TABLET | Freq: Four times a day (QID) | ORAL | Status: DC | PRN
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
## 2014-08-31 NOTE — Progress Notes (Signed)
Pre visit review using our clinic review tool, if applicable. No additional management support is needed unless otherwise documented below in the visit note. 

## 2014-09-01 ENCOUNTER — Encounter: Payer: Self-pay | Admitting: Family Medicine

## 2014-09-01 DIAGNOSIS — F0781 Postconcussional syndrome: Secondary | ICD-10-CM

## 2014-09-01 HISTORY — DX: Postconcussional syndrome: F07.81

## 2014-09-06 ENCOUNTER — Other Ambulatory Visit: Payer: Self-pay

## 2014-09-06 ENCOUNTER — Telehealth: Payer: Self-pay | Admitting: Family Medicine

## 2014-09-06 DIAGNOSIS — F0781 Postconcussional syndrome: Secondary | ICD-10-CM

## 2014-09-06 DIAGNOSIS — S0990XS Unspecified injury of head, sequela: Secondary | ICD-10-CM

## 2014-09-06 MED ORDER — OXYCODONE-ACETAMINOPHEN 10-325 MG PO TABS: 1.0000 | ORAL_TABLET | ORAL | Status: DC | PRN

## 2014-09-06 NOTE — Telephone Encounter (Signed)
i am not surprised he is still having headaches. We talked about this at length - as a result of his injury to his head.   I would keep taking amitryptiline.
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
## 2014-09-06 NOTE — Telephone Encounter (Signed)
Kena notified prescription is ready to be picked up at front desk.  Also advised that Dr. Patsy Lageropland wants Lyda PeroneKirk to keep taking the amitriptyline as prescribed.

## 2014-09-06 NOTE — Telephone Encounter (Signed)
When pt wife came to pick up rx she stated that pt was up all night throwing up and if the amitriptyline does not work for his headaches, what is the next step?  # [phone]

## 2014-09-06 NOTE — Telephone Encounter (Addendum)
He needs to follow my instructions about absolute rest.   I am going to consult Neurology for their assistance with post-concussive symptoms with longer length of symptoms.

## 2014-09-07 ENCOUNTER — Encounter: Payer: Self-pay | Admitting: Family Medicine

## 2014-09-07 NOTE — Telephone Encounter (Signed)
Kena notified as instructed by telephone.

## 2014-10-17 IMAGING — CR DG CHEST 2V
2 series · 2 of 2 positions shown · non-contrast
Comparison: None.

CLINICAL DATA: Wheezing and shortness of breath.  Chest pain.

CHEST - 2 VIEW

[w chest pa]
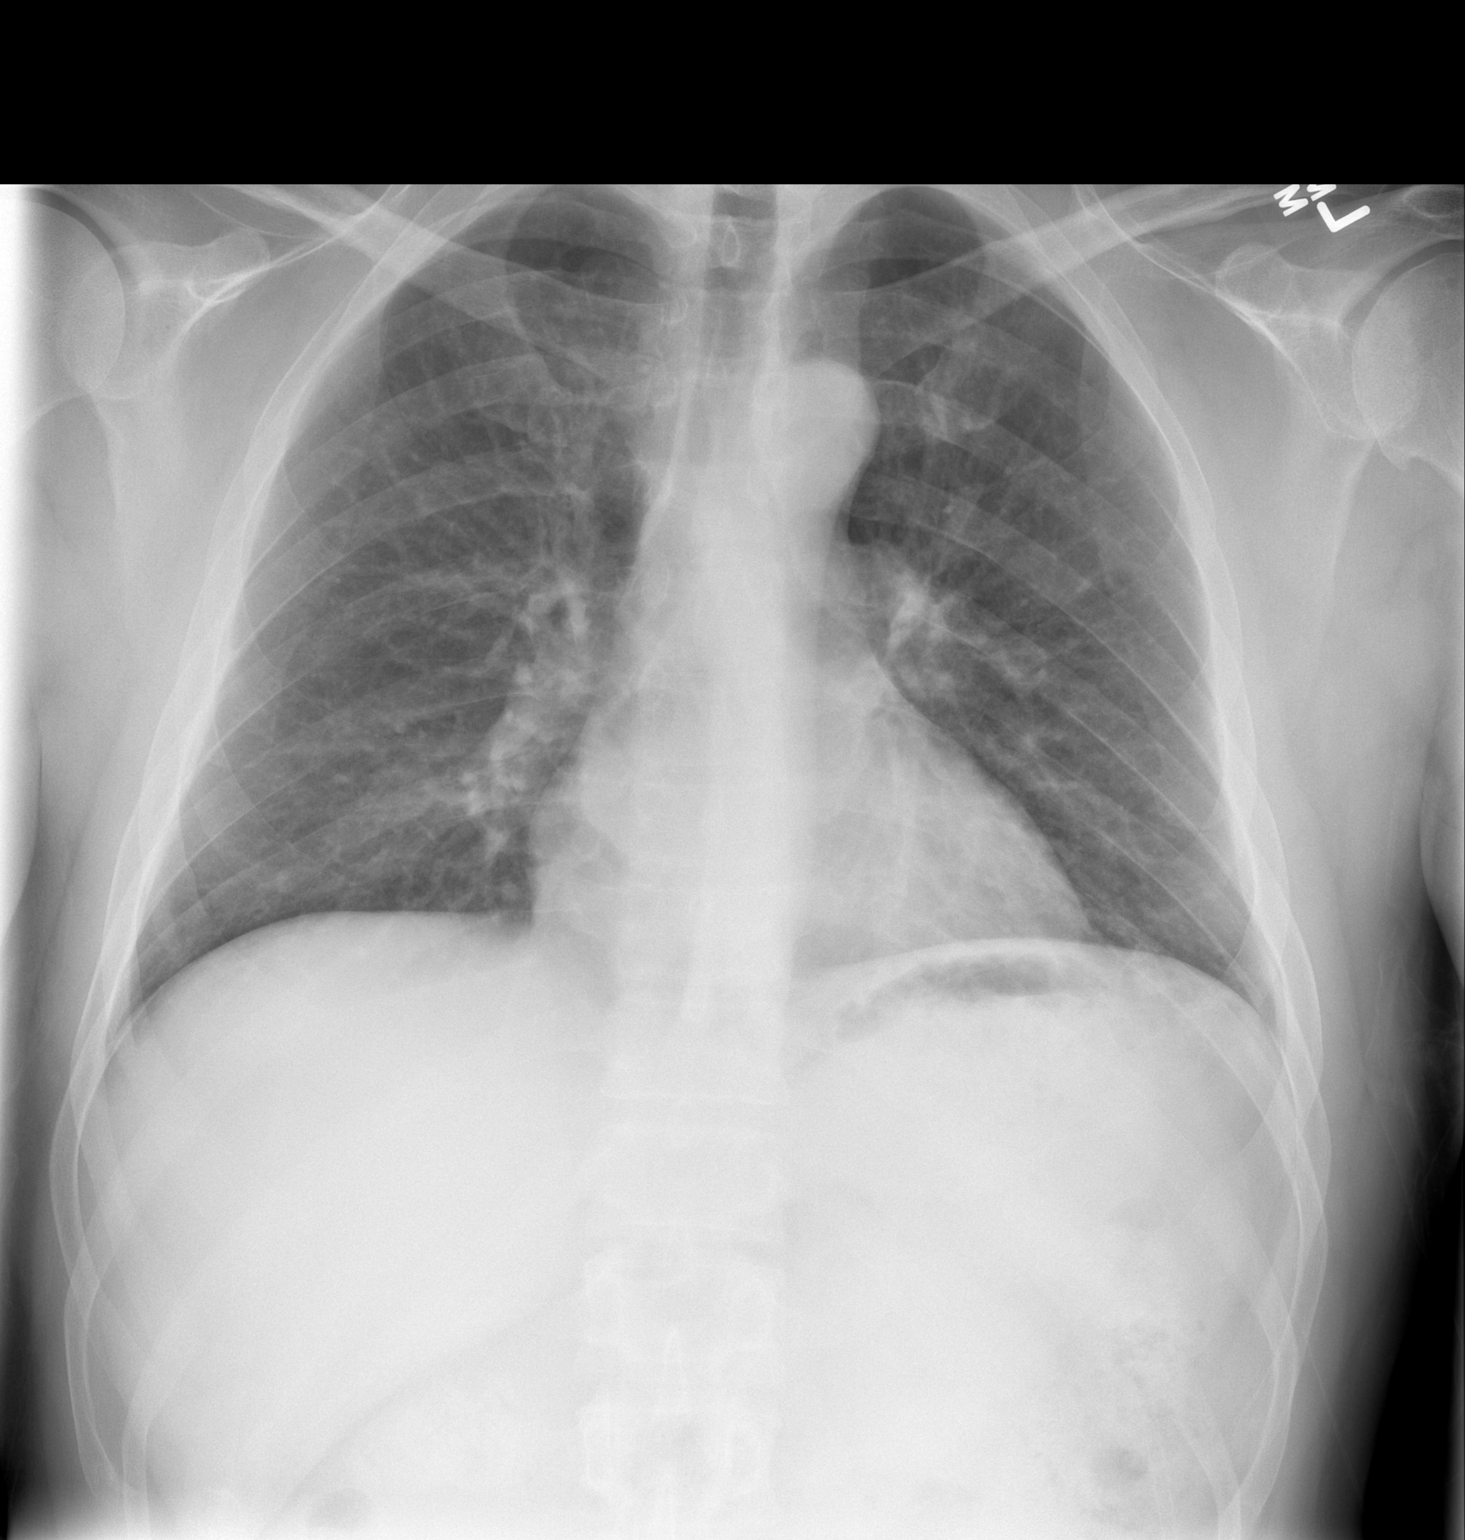

[w chest lat]
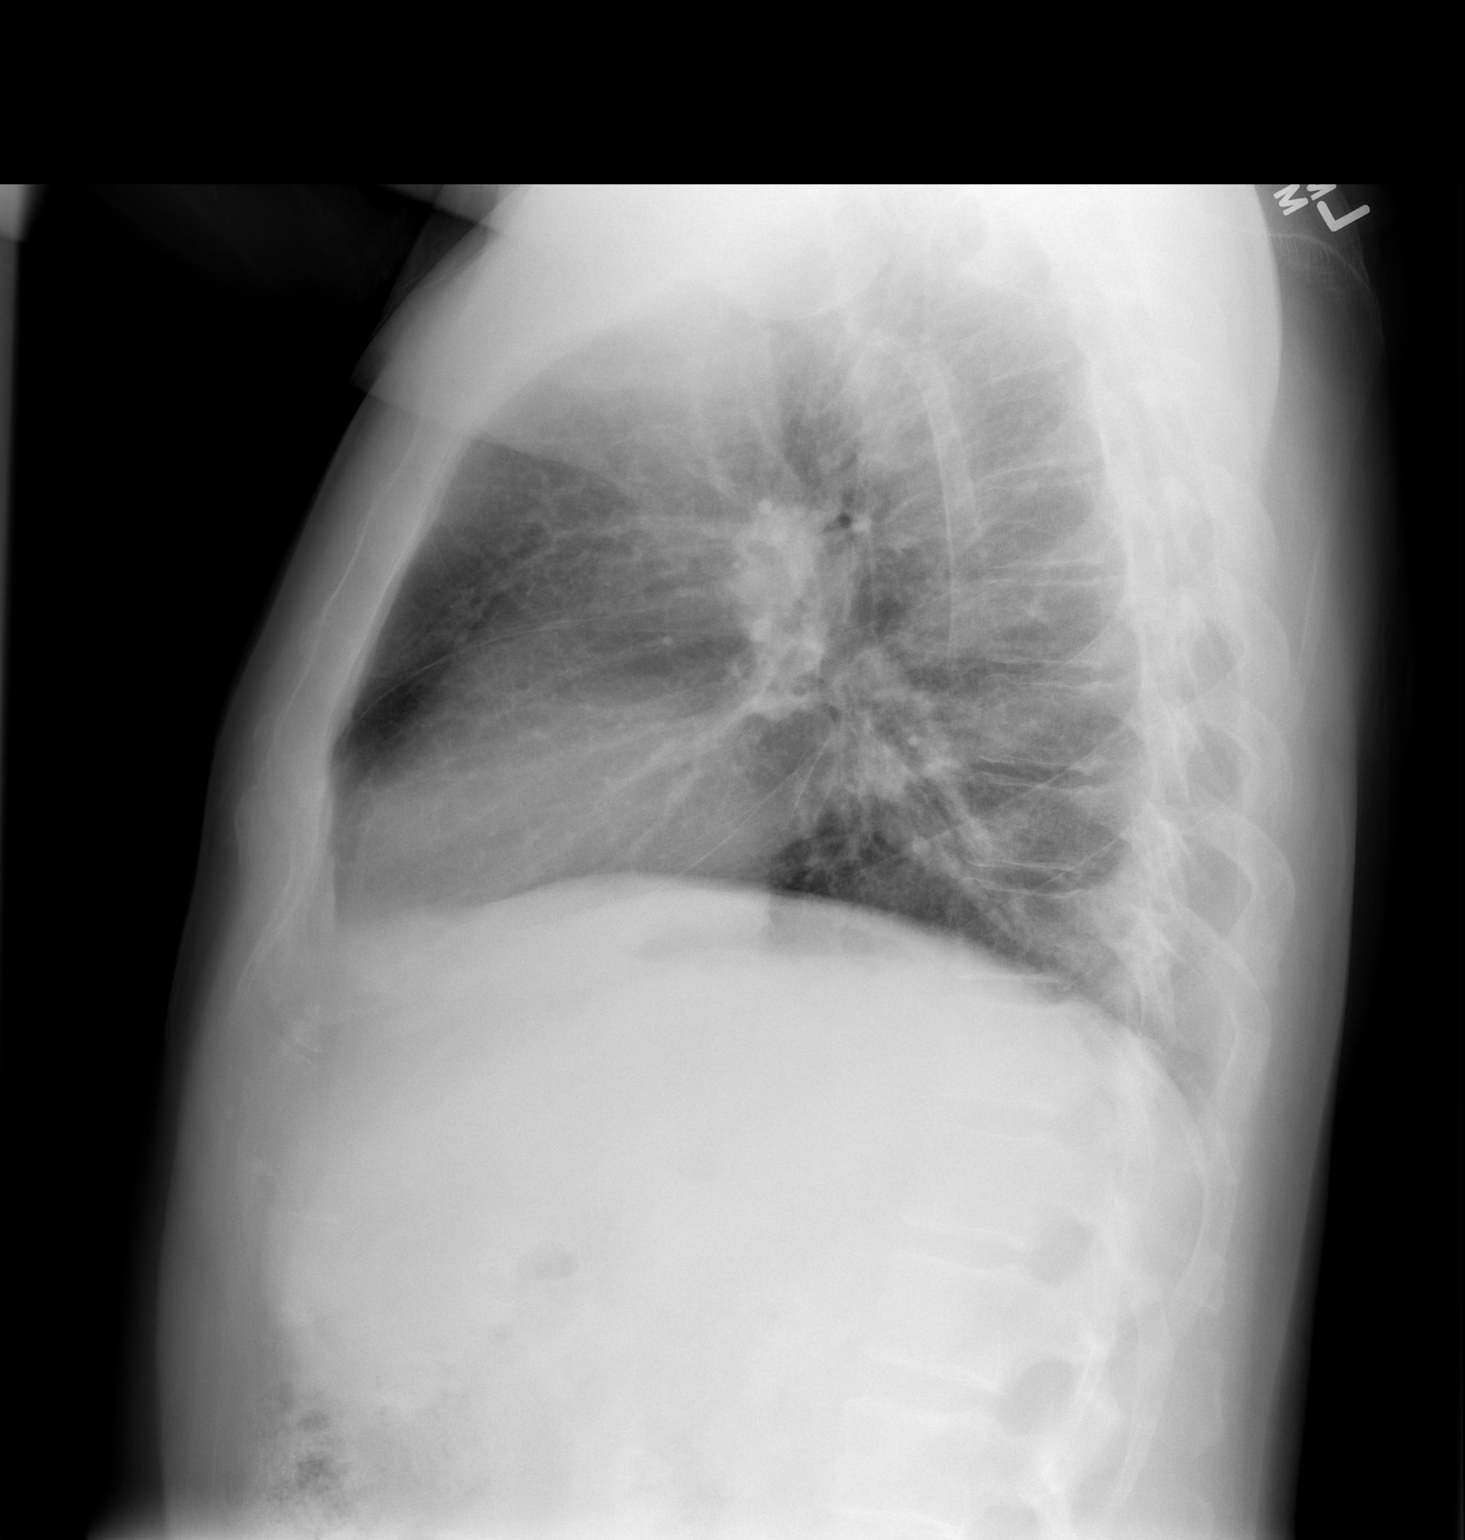

[2 of 2 positions shown; findings below may reference images not displayed]

FINDINGS: The heart size is normal.  Mild interstitial coarsening
is likely chronic.  The lung volumes are low.  No focal airspace
disease is evident.
IMPRESSION: 1.  Low lung volumes.
2.  No acute cardiopulmonary disease.

## 2014-11-14 ENCOUNTER — Telehealth: Payer: Self-pay

## 2014-11-14 NOTE — Telephone Encounter (Signed)
Pt needs appt to be seen if not keeping down liquids in 24 hours, make appt tommorow with PCP. Push fluids, slowly at first, advance as tolerated.

## 2014-11-14 NOTE — Telephone Encounter (Signed)
PLEASE NOTE: All timestamps contained within this report are represented as [place] Standard Time. CONFIDENTIALTY NOTICE: This fax transmission is intended only for the addressee. It contains information that is legally privileged, confidential or otherwise protected from use or disclosure. If you are not the intended recipient, you are strictly prohibited from reviewing, disclosing, copying using or disseminating any of this information or taking any action in reliance on or regarding this information. If you have received this fax in error, please notify us immediately by telephone so that we can arrange for its return to us. Phone: [phone], Toll-Free: [phone], Fax: [phone] Page: 1 of 2 Call Id: [id number] [facility] Client TELEPHONE ADVICE RECORD [facility] Patient Name: Miguel Jimenez Miguel Jimenez Gender: Male DOB: 10/12/1966 Age: [age] Y 7 M 6 D Return Phone Number: [phone] (Primary) Address: [address] City[place]State/Zip: [place] [address] Client [facility] Physician Copland, Miguel Jimenez Contact Type Call Call Type Triage / Clinical Caller Name Miguel Jimenez Relationship To Patient Mother Return Phone Number [phone] (Primary) Chief Complaint Vomiting Initial Comment Caller states husband has had a stomach bug he has been vomiting. PreDisposition Did not know what to do Nurse Assessment Nurse: Miguel Elizabethrumbull, RN, Miguel Jimenez Date/Time Lamount Cohen(Eastern Time): 11/14/2014 1:40:59 PM Confirm and document reason for call. If symptomatic, describe symptoms. ---Caller states Miguel Jimenez developed vomiting 2-3 days ago. No fever or diarrhea. Has the patient traveled out of the country within the last 30 days? ---No Does the patient require triage? ---Yes Related visit to physician within the last 2 weeks? ---No Does the PT have any chronic conditions? (i.e. diabetes, asthma,  etc.) ---Yes List chronic conditions. ---Herniated Disk in back, Anxiety, Stomach Ulcer Guidelines Guideline Title Affirmed Question Affirmed Notes Nurse Date/Time Lamount Cohen(Eastern Time) Vomiting [1] MILD or MODERATE vomiting AND [2] present > 48 hours (2 days) (Exception: mild vomiting with associated diarrhea) Trumbull, RN, Miguel Jimenez 11/14/2014 1:42:24 PM Disp. Time Lamount Cohen(Eastern Time) Disposition Final User 11/14/2014 1:45:41 PM See Physician within 24 Hours Yes Trumbull, RN, Frann Riderathy Caller Understands: Yes Disagree/Comply: Comply PLEASE NOTE: All timestamps contained within this report are represented as [place] Standard Time. CONFIDENTIALTY NOTICE: This fax transmission is intended only for the addressee. It contains information that is legally privileged, confidential or otherwise protected from use or disclosure. If you are not the intended recipient, you are strictly prohibited from reviewing, disclosing, copying using or disseminating any of this information or taking any action in reliance on or regarding this information. If you have received this fax in error, please notify us immediately by telephone so that we can arrange for its return to us. Phone: [phone], Toll-Free: [phone], Fax: [phone] Page: 2 of 2 Call Id: [id number] Care Advice Given Per Guideline SEE PHYSICIAN WITHIN 24 HOURS: * IF OFFICE WILL BE OPEN: You need to be examined within the next 24 hours. Call your doctor when the office opens, and make an appointment. CLEAR LIQUIDS: Try to sip small amounts (1 tablespoon or 15 ml) of liquid frequently (every 5 minutes) for 8 hours, rather than trying to drink a lot of liquid all at one time. * Sip water or a rehydration drink (e.g., Gatorade or Powerade). * Other options: 1/2 strength flat lemon-lime soda or ginger ale. * After 4 hours without vomiting, increase the amount. SOLID FOOD: * You may begin eating bland foods after 8 hours without vomiting. * Start with saltine  crackers, white  bread, rice, mashed potatoes, cereal, applesauce, etc. AVOID NON-ESSENTIAL MEDS: * Discontinue all vitamins and non-prescription medicines for 24 hours. (Reason: may make vomiting worse.) CALL BACK IF: * You become worse. CARE ADVICE per Vomiting (Adult) guideline. After Care Instructions Given Call Event Type User Date / Time Description Comments User: Colonel Baldathy, Trumbull, RN Date/Time Lamount Cohen(Eastern Time): 11/14/2014 1:59:58 PM Miguel Jimenez shares that her husband really doesn't want to go to the office and she asks to send a message to MD to see if they are agreeable to calling in a medication to stop the vomiting. Called the office backline and Lyla SonCarrie will assist callers further. Miguel Jimenez disconnected before she could be connected to [place]. Lyla SonCarrie states she will call Miguel Jimenez back now. Referrals REFERRED TO PCP OFFICE

## 2014-11-14 NOTE — Telephone Encounter (Signed)
Patient advised.  Patient states he has to go in to work tomorrow and asked for a late afternoon appt.  Appt scheduled with Dr. Para Marchuncan.

## 2014-11-15 ENCOUNTER — Ambulatory Visit: Payer: PRIVATE HEALTH INSURANCE | Admitting: Family Medicine

## 2014-11-21 ENCOUNTER — Emergency Department: Payer: Self-pay | Admitting: Emergency Medicine

## 2014-11-21 LAB — COMPREHENSIVE METABOLIC PANEL
ANION GAP: 4 — AB (ref 7–16)
AST: 24 U/L (ref 15–37)
Albumin: 3.3 g/dL — ABNORMAL LOW (ref 3.4–5.0)
Alkaline Phosphatase: 86 U/L
BILIRUBIN TOTAL: 0.2 mg/dL (ref 0.2–1.0)
BUN: 14 mg/dL (ref 7–18)
CO2: 28 mmol/L (ref 21–32)
Calcium, Total: 8.3 mg/dL — ABNORMAL LOW (ref 8.5–10.1)
Chloride: 108 mmol/L — ABNORMAL HIGH (ref 98–107)
Creatinine: 0.99 mg/dL (ref 0.60–1.30)
EGFR (African American): >60
GLUCOSE: 92 mg/dL (ref 65–99)
Osmolality: 280 (ref 275–301)
Potassium: 4.3 mmol/L (ref 3.5–5.1)
SGPT (ALT): 38 U/L
Sodium: 140 mmol/L (ref 136–145)
TOTAL PROTEIN: 6.5 g/dL (ref 6.4–8.2)

## 2014-11-21 LAB — CBC
HCT: 45.5 % (ref 40.0–52.0)
HGB: 15.2 g/dL (ref 13.0–18.0)
MCH: 34 pg (ref 26.0–34.0)
MCHC: 33.3 g/dL (ref 32.0–36.0)
MCV: 102 fL — ABNORMAL HIGH (ref 80–100)
Platelet: 183 10*3/uL (ref 150–440)
RBC: 4.46 10*6/uL (ref 4.40–5.90)
RDW: 12.9 % (ref 11.5–14.5)
WBC: 9.1 10*3/uL (ref 3.8–10.6)

## 2014-12-14 ENCOUNTER — Other Ambulatory Visit: Payer: Self-pay | Admitting: Family Medicine

## 2014-12-14 NOTE — Telephone Encounter (Signed)
Last office visit 08/31/2014.  Last refilled 08/31/2014 for #90 with 3 refills.  Ok to refill?

## 2014-12-14 NOTE — Telephone Encounter (Signed)
Can wait for PCP

## 2014-12-15 NOTE — Telephone Encounter (Signed)
Called to [facility] [address] [place]. 

## 2014-12-15 NOTE — Telephone Encounter (Signed)
Ok to refill #90, 3 refills 

## 2015-01-16 ENCOUNTER — Emergency Department: Payer: Self-pay | Admitting: Student

## 2015-01-16 ENCOUNTER — Emergency Department (HOSPITAL_COMMUNITY)
Admission: EM | Admit: 2015-01-16 | Discharge: 2015-01-16 | Discharge status: Against Medical Advice | Payer: PRIVATE HEALTH INSURANCE | Attending: Emergency Medicine | Admitting: Emergency Medicine

## 2015-01-16 ENCOUNTER — Encounter (HOSPITAL_COMMUNITY): Payer: Self-pay | Admitting: Emergency Medicine

## 2015-01-16 DIAGNOSIS — Z4801 Encounter for change or removal of surgical wound dressing: Secondary | ICD-10-CM | POA: Diagnosis not present

## 2015-01-16 DIAGNOSIS — M549 Dorsalgia, unspecified: Secondary | ICD-10-CM | POA: Insufficient documentation

## 2015-01-16 DIAGNOSIS — Z720 Tobacco use: Secondary | ICD-10-CM | POA: Insufficient documentation

## 2015-01-16 NOTE — ED Notes (Signed)
Updated pt on wait time. Pt agreed to stay longer.

## 2015-01-16 NOTE — ED Notes (Signed)
Pt sts had back sx on 2/5 and now having swelling around incision site; pt sts some pain in sciatic nerve as well; pt denies discharge from wound

## 2015-01-16 NOTE — ED Notes (Signed)
Pt sts he can no longer wait.  Sts he has two daughters at home.  Will come back if symptoms get worse.

## 2015-01-19 ENCOUNTER — Other Ambulatory Visit (HOSPITAL_COMMUNITY): Payer: Self-pay | Admitting: Neurosurgery

## 2015-01-25 ENCOUNTER — Encounter (HOSPITAL_COMMUNITY): Payer: Self-pay

## 2015-01-25 ENCOUNTER — Inpatient Hospital Stay (HOSPITAL_COMMUNITY)
Admission: EM | Admit: 2015-01-25 | Discharge: 2015-01-26 | DRG: 520 | Disposition: A | Discharge status: Home / Self Care | Payer: PRIVATE HEALTH INSURANCE | Attending: Neurosurgery | Admitting: Neurosurgery

## 2015-01-25 DIAGNOSIS — F17210 Nicotine dependence, cigarettes, uncomplicated: Secondary | ICD-10-CM | POA: Diagnosis present

## 2015-01-25 DIAGNOSIS — M5126 Other intervertebral disc displacement, lumbar region: Secondary | ICD-10-CM | POA: Diagnosis present

## 2015-01-25 DIAGNOSIS — M5116 Intervertebral disc disorders with radiculopathy, lumbar region: Secondary | ICD-10-CM | POA: Diagnosis not present

## 2015-01-25 DIAGNOSIS — Z79899 Other long term (current) drug therapy: Secondary | ICD-10-CM

## 2015-01-25 DIAGNOSIS — Z419 Encounter for procedure for purposes other than remedying health state, unspecified: Secondary | ICD-10-CM

## 2015-01-25 DIAGNOSIS — F411 Generalized anxiety disorder: Secondary | ICD-10-CM | POA: Diagnosis present

## 2015-01-25 DIAGNOSIS — M545 Low back pain: Secondary | ICD-10-CM | POA: Diagnosis not present

## 2015-01-25 LAB — POCT I-STAT TROPONIN I
TROPONIN I, POC: 0.01 ng/mL (ref 0.00–0.08)
Troponin i, poc: 0.01 ng/mL (ref 0.00–0.08)

## 2015-01-25 LAB — BASIC METABOLIC PANEL
Anion gap: 4 — ABNORMAL LOW (ref 5–15)
BUN: 7 mg/dL (ref 6–23)
CALCIUM: 8.8 mg/dL (ref 8.4–10.5)
CO2: 26 mmol/L (ref 19–32)
Chloride: 110 mmol/L (ref 96–112)
Creatinine, Ser: 0.87 mg/dL (ref 0.50–1.35)
GFR calc Af Amer: >90 mL/min (ref >90)
GLUCOSE: 117 mg/dL — AB (ref 70–99)
Potassium: 3.8 mmol/L (ref 3.5–5.1)
SODIUM: 140 mmol/L (ref 135–145)

## 2015-01-25 LAB — CBC WITH DIFFERENTIAL/PLATELET
Basophils Absolute: 0 10*3/uL (ref 0.0–0.1)
Basophils Relative: 0 % (ref 0–1)
EOS ABS: 0.5 10*3/uL (ref 0.0–0.7)
EOS PCT: 7 % — AB (ref 0–5)
HEMATOCRIT: 42.1 % (ref 39.0–52.0)
HEMOGLOBIN: 14.2 g/dL (ref 13.0–17.0)
Lymphocytes Relative: 28 % (ref 12–46)
Lymphs Abs: 2 10*3/uL (ref 0.7–4.0)
MCH: 34.2 pg — AB (ref 26.0–34.0)
MCHC: 33.7 g/dL (ref 30.0–36.0)
MCV: 101.4 fL — ABNORMAL HIGH (ref 78.0–100.0)
MONOS PCT: 10 % (ref 3–12)
Monocytes Absolute: 0.7 10*3/uL (ref 0.1–1.0)
Neutro Abs: 3.8 10*3/uL (ref 1.7–7.7)
Neutrophils Relative %: 55 % (ref 43–77)
Platelets: 242 10*3/uL (ref 150–400)
RBC: 4.15 MIL/uL — ABNORMAL LOW (ref 4.22–5.81)
RDW: 14 % (ref 11.5–15.5)
WBC: 7 10*3/uL (ref 4.0–10.5)

## 2015-01-25 LAB — TROPONIN I

## 2015-01-25 MED ORDER — OXYCODONE HCL 5 MG PO TABS
15.0000 mg | ORAL_TABLET | ORAL | Status: DC | PRN
  Administered 2015-01-25 – 2015-01-26 (×6): 15 mg via ORAL
  Filled 2015-01-25 (×5): qty 3

## 2015-01-25 MED ORDER — DIPHENHYDRAMINE HCL 12.5 MG/5ML PO ELIX: 12.5000 mg | ORAL_SOLUTION | Freq: Four times a day (QID) | ORAL | Status: DC | PRN

## 2015-01-25 MED ORDER — DIPHENHYDRAMINE HCL 50 MG/ML IJ SOLN: 12.5000 mg | Freq: Four times a day (QID) | INTRAMUSCULAR | Status: DC | PRN

## 2015-01-25 MED ORDER — ACETAMINOPHEN 650 MG RE SUPP: 650.0000 mg | Freq: Four times a day (QID) | RECTAL | Status: DC | PRN

## 2015-01-25 MED ORDER — PANTOPRAZOLE SODIUM 40 MG IV SOLR
40.0000 mg | Freq: Two times a day (BID) | INTRAVENOUS | Status: DC
  Filled 2015-01-25 (×2): qty 40

## 2015-01-25 MED ORDER — ONDANSETRON HCL 4 MG/2ML IJ SOLN: 4.0000 mg | Freq: Four times a day (QID) | INTRAMUSCULAR | Status: DC | PRN

## 2015-01-25 MED ORDER — MORPHINE SULFATE (PF) 1 MG/ML IV SOLN: INTRAVENOUS | Status: DC

## 2015-01-25 MED ORDER — DIPHENHYDRAMINE HCL 12.5 MG/5ML PO ELIX
12.5000 mg | ORAL_SOLUTION | Freq: Four times a day (QID) | ORAL | Status: DC | PRN
  Filled 2015-01-25: qty 5

## 2015-01-25 MED ORDER — SODIUM CHLORIDE 0.9 % IJ SOLN: 9.0000 mL | INTRAMUSCULAR | Status: DC | PRN

## 2015-01-25 MED ORDER — DIAZEPAM 5 MG PO TABS
5.0000 mg | ORAL_TABLET | Freq: Once | ORAL | Status: AC
  Administered 2015-01-25: 5 mg via ORAL
  Filled 2015-01-25: qty 1

## 2015-01-25 MED ORDER — NALOXONE HCL 0.4 MG/ML IJ SOLN
0.4000 mg | INTRAMUSCULAR | Status: DC | PRN
Start: 2015-01-25 — End: 2015-04-04

## 2015-01-25 MED ORDER — NALOXONE HCL 0.4 MG/ML IJ SOLN: 0.4000 mg | INTRAMUSCULAR | Status: DC | PRN

## 2015-01-25 MED ORDER — DEXAMETHASONE SODIUM PHOSPHATE 4 MG/ML IJ SOLN
12.0000 mg | Freq: Four times a day (QID) | INTRAMUSCULAR | Status: DC
  Administered 2015-01-26: 12 mg via INTRAVENOUS

## 2015-01-25 MED ORDER — HYDROMORPHONE HCL 1 MG/ML IJ SOLN
1.0000 mg | Freq: Once | INTRAMUSCULAR | Status: AC
  Administered 2015-01-25: 1 mg via INTRAVENOUS
  Filled 2015-01-25: qty 1

## 2015-01-25 MED ORDER — LACTATED RINGERS IV SOLN
INTRAVENOUS | Status: DC
  Administered 2015-01-25: 13:00:00 via INTRAVENOUS
  Filled 2015-01-25 (×5): qty 1000

## 2015-01-25 MED ORDER — DOCUSATE SODIUM 100 MG PO CAPS
100.0000 mg | ORAL_CAPSULE | Freq: Two times a day (BID) | ORAL | Status: DC
  Administered 2015-01-25 – 2015-01-26 (×2): 100 mg via ORAL
  Filled 2015-01-25 (×2): qty 1

## 2015-01-25 MED ORDER — ACETAMINOPHEN 325 MG PO TABS
650.0000 mg | ORAL_TABLET | Freq: Four times a day (QID) | ORAL | Status: DC | PRN
  Administered 2015-01-25: 650 mg via ORAL
  Filled 2015-01-25: qty 2

## 2015-01-25 NOTE — Progress Notes (Signed)
Pt slept most of the afternoon. No acute distress noted. Pt on 3L O2 sats 98-99% . Will continue to monitor pt.

## 2015-01-25 NOTE — H&P (Signed)
Subjective: The patient is a 49 year old white male who's had some chronic back pain. About 2 weeks ago he began having severe right leg pain. He was worked up with a lumbar MRI which demonstrated herniated disc behind the L5 vertebral body. He saw Dr. Conchita Paris who discussed surgery with him. In fact he has been scheduled for discectomy tomorrow. The patient developed severe pain beginning last evening. The patient came to the [facility] emergency department.  Presently the patient is accompanied by his wife. He complains of severe back pain and right leg pain.   Past Medical History  Diagnosis Date  . Migraine   . Generalized anxiety disorder 08/05/2013  . Orbital floor (blow-out), closed fracture 08/18/2014  . Retinal detachment 08/18/2014  . Concussion without loss of consciousness 08/18/2014  . Post concussive syndrome 09/01/2014    Past Surgical History  Procedure Laterality Date  . Knee surgery    . Back surgery      No Known Allergies  History  Substance Use Topics  . Smoking status: Current Every Day Smoker -- 0.50 packs/day    Types: Cigarettes  . Smokeless tobacco: Former Neurosurgeon    Types: Snuff  . Alcohol Use: 0.5 oz/week    1 Standard drinks or equivalent per week     Comment: occasional    No family history on file. Prior to Admission medications   Medication Sig Start Date End Date Taking? Authorizing Provider  MELATONIN PO Take 1 capsule by mouth at bedtime as needed (for sleep).   Yes Historical Provider, MD  oxyCODONE-acetaminophen (PERCOCET) 7.5-325 MG per tablet Take 1 tablet by mouth 2 (two) times daily as needed. For pain 12/27/14  Yes Historical Provider, MD  ALPRAZolam Prudy Feeler) 0.5 MG tablet take 1 tablet by mouth three times a day if needed for anxiety Patient not taking: Reported on 01/25/2015 12/15/14   Hannah Beat, MD  amitriptyline (ELAVIL) 25 MG tablet Take 1 tablet (25 mg total) by mouth at bedtime. Patient not taking: Reported on 01/25/2015 08/31/14    Hannah Beat, MD  oxyCODONE-acetaminophen (PERCOCET) 10-325 MG per tablet Take 1 tablet by mouth every 4 (four) hours as needed for pain. Patient not taking: Reported on 01/25/2015 09/06/14   Hannah Beat, MD  promethazine (PHENERGAN) 25 MG tablet Take 1 tablet (25 mg total) by mouth every 6 (six) hours as needed for nausea or vomiting. Patient not taking: Reported on 01/25/2015 08/31/14   Hannah Beat, MD     Review of Systems  Positive ROS: As above  All other systems have been reviewed and were otherwise negative with the exception of those mentioned in the HPI and as above.    Objective: Vital signs in last 24 hours: Temp:  [97.7 F (36.5 C)] 97.7 F (36.5 C) (02/25 0945) Pulse Rate:  [83-86] 83 (02/25 1045) Resp:  [13-20] 13 (02/25 1045) BP: (132-141)/(103) 132/103 mmHg (02/25 1045) SpO2:  [96 %-98 %] 98 % (02/25 1045)  Using exam:  General: An alert 49 year old white male who complains of back and right leg pain.  HEENT: Normocephalic  Thorax: Symmetric  Abdomen: Soft  Extremities: No obvious abnormalities  Back exam: The patient is diffusely tender  Neurologic exam: The patient is alert and oriented 3. His strength is difficult to exactly assess as he gives way diffusely and easily to pain. The best I can tell his strength is normal and his bilateral heart facets, gastrocnemius, dorsiflexors. There is no perineal numbness   Data Review Lab Results  Component Value Date   WBC 7.0 01/25/2015   HGB 14.2 01/25/2015   HCT 42.1 01/25/2015   MCV 101.4* 01/25/2015   PLT 242 01/25/2015   Lab Results  Component Value Date   NA 136* 08/16/2014   K 4.2 08/16/2014   CL 100 08/16/2014   CO2 28 08/16/2014   BUN 8 08/16/2014   CREATININE 0.99 08/16/2014   GLUCOSE 99 08/16/2014   No results found for: INR, PROTIME  Assessment/Plan: Right L4-5/L5-S1 herniated disc, lumbago, lumbar radiculopathy: I discussed the situation with the patient is wife. We are  going to admit him for IV steroids and pain management.  Will plan to proceed with surgery tomorrow as scheduled.   Kimesha Claxton D 01/25/2015 11:22 AM

## 2015-01-25 NOTE — Progress Notes (Signed)
[place].  Will monitor for pt's arrival to unit.   Andrew AuVafiadis, Foye Damron I 01/25/2015 1:26 PM
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
## 2015-01-25 NOTE — ED Notes (Signed)
Family member states she needs a Engineer, civil (consulting)nurse. This RN goes in room and pt is standing up beside the stretcher and family member states he has peed all over the floor. Pt states he had tried to get the urinal but "it just started coming" and he started peeing everywhere. RN had to change the sheet on the stretcher and cleaned the floor, emptied urinal with 800 ml urine in it as well. Changed the pt's gown and cleaned the wires and plugged pt back up to EKG, BP and pulse ox.

## 2015-01-25 NOTE — ED Notes (Signed)
pharmacy aware of medications to prepare.

## 2015-01-25 NOTE — Progress Notes (Signed)
Pt arrived to unit per stretcher from ED with nurse tech. No acute distress noted.  Assessment performed as charted. Will monitor pt closely   Andrew AuVafiadis, Kindred Heying I 01/25/2015 1:49 PM

## 2015-01-25 NOTE — ED Provider Notes (Signed)
CSN: [id number]     Arrival date & time 01/25/15  0945 History   First MD Initiated Contact with Patient 01/25/15 [phone]     Chief Complaint  Patient presents with  . Back Pain     (Consider location/radiation/quality/duration/timing/severity/associated sxs/prior Treatment) Patient is a 49 y.o. male presenting with back pain.  Back Pain Location:  Lumbar spine Quality:  Stabbing Radiates to:  Does not radiate Pain severity:  Severe Pain is:  Same all the time Onset quality:  Gradual Timing:  Constant Progression:  Unchanged Chronicity:  Chronic Context comment:  Had surgery 2/5 with post operative fluid collection Relieved by:  Nothing Worsened by:  Movement, palpation, sitting, touching and twisting   Past Medical History  Diagnosis Date  . Migraine   . Generalized anxiety disorder 08/05/2013  . Orbital floor (blow-out), closed fracture 08/18/2014  . Retinal detachment 08/18/2014  . Concussion without loss of consciousness 08/18/2014  . 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 Knee surgery    . Back surgery     No family history on file. History  Substance Use Topics  . Smoking status: Current Every Day Smoker -- 0.50 packs/day    Types: Cigarettes  . Smokeless tobacco: Former Neurosurgeon    Types: Snuff  . Alcohol Use: 0.5 oz/week    1 Standard drinks or equivalent per week     Comment: occasional    Review of Systems  Musculoskeletal: Positive for back pain.  All other systems reviewed and are negative.     Allergies  Review of patient's allergies indicates no known allergies.  Home Medications   Prior to Admission medications   Medication Sig Start Date End Date Taking? Authorizing Provider  MELATONIN PO Take 1 capsule by mouth at bedtime as needed (for sleep).   Yes Historical Provider, MD  oxyCODONE-acetaminophen (PERCOCET) 7.5-325 MG per tablet Take 1 tablet by mouth 2 (two) times daily as needed. For pain  12/27/14  Yes Historical Provider, MD  ALPRAZolam Prudy Feeler) 0.5 MG tablet take 1 tablet by mouth three times a day if needed for anxiety Patient not taking: Reported on 01/25/2015 12/15/14   Hannah Beat, MD  amitriptyline (ELAVIL) 25 MG tablet Take 1 tablet (25 mg total) by mouth at bedtime. Patient not taking: Reported on 01/25/2015 08/31/14   Hannah Beat, MD  oxyCODONE-acetaminophen (PERCOCET) 10-325 MG per tablet Take 1 tablet by mouth every 4 (four) hours as needed for pain. Patient not taking: Reported on 01/25/2015 09/06/14   Hannah Beat, MD  promethazine (PHENERGAN) 25 MG tablet Take 1 tablet (25 mg total) by mouth every 6 (six) hours as needed for nausea or vomiting. Patient not taking: Reported on 01/25/2015 08/31/14   Hannah Beat, MD   BP 122/86 mmHg  Pulse 75  Temp(Src) 98.3 F (36.8 C) (Oral)  Resp 14  SpO2 99% Physical Exam  Constitutional: He is oriented to person, place, and time. He appears well-developed and well-nourished.  HENT:  Head: Normocephalic and atraumatic.  Eyes: Conjunctivae and EOM are normal.  Neck: Normal range of motion. Neck supple.  Cardiovascular: Normal rate, regular rhythm and normal heart sounds.   Pulmonary/Chest: Effort normal and breath sounds normal. No respiratory distress.  Abdominal: He exhibits no distension. There is no tenderness. There is no rebound and no guarding.  Musculoskeletal: Normal range of motion.       Thoracic back: He exhibits tenderness and bony tenderness.  Lumbar back: He exhibits tenderness, bony tenderness, swelling (R paramidline swelling) and pain.  Neurological: He is alert and oriented to person, place, and time.  Skin: Skin is warm and dry.  Vitals reviewed.   ED Course  Procedures (including critical care time) Labs Review Labs Reviewed  CBC WITH DIFFERENTIAL/PLATELET - Abnormal; Notable for the following:    RBC 4.15 (*)    MCV 101.4 (*)    MCH 34.2 (*)    Eosinophils Relative 7 (*)    All  other components within normal limits  BASIC METABOLIC PANEL - Abnormal; Notable for the following:    Glucose, Bld 117 (*)    Anion gap 4 (*)    All other components within normal limits  TROPONIN I  I-STAT TROPOININ, ED  I-STAT TROPOININ, ED    Imaging Review No results found.   EKG Interpretation   Date/Time:  Thursday January 25 2015 10:34:25 EST Ventricular Rate:  76 PR Interval:  172 QRS Duration: 77 QT Interval:  378 QTC Calculation: 425 R Axis:   61 Text Interpretation:  Sinus rhythm Nonspecific T abnormalities, diffuse  leads No old tracing to compare Confirmed by Mirian MoGentry, Matthew [phone]([id number]) on  01/25/2015 10:43:51 AM      MDM   Final diagnoses:  None    49 y.o. male with pertinent PMH of chronic back pain with recent surgical repair, anxiety presents with acute on chronic back pain, worst over last day.  He has a ho syncope 2/2 pain and states he passed out 3 times today due to pain.  No chest pain or dyspnea.  Physical exam benign, strength intact.  Pt endorses baseline urinary retention for months without acute change recent.  No saddle anesthesia on exam today.    Consulted NSU, pt admitted.  I have reviewed all laboratory and imaging studies if ordered as above  1. Back pain      Mirian MoMatthew Gentry, MD 01/25/15 [phone]

## 2015-01-25 NOTE — ED Notes (Signed)
Per [facility], pt had a back surgery on 5th of this month in the office as outpatient by Dr. Conchita ParisNundkumar. Is scheduled to have it redone tomorrow but couldn't wait, has had decreased sensation down his right leg since the surgery and has been passing out when standing up d/t the pain. Took 2 percocet this morning. Has had 100 mcg fentanyl by EMS and 8 mg zofran.

## 2015-01-25 NOTE — Anesthesia Preprocedure Evaluation (Addendum)
Anesthesia Evaluation  Patient identified by MRN, date of birth, ID band Patient awake    Reviewed: Allergy & Precautions, NPO status , Patient's Chart, lab work & pertinent test results  History of Anesthesia Complications Negative for: history of anesthetic complications  Airway Mallampati: II  TM Distance: >3 FB Neck ROM: Full    Dental no notable dental hx. (+) Dental Advisory Given, Poor Dentition   Pulmonary Current Smoker,  breath sounds clear to auscultation  Pulmonary exam normal       Cardiovascular negative cardio ROS  Rhythm:Regular Rate:Normal     Neuro/Psych  Headaches, PSYCHIATRIC DISORDERS Anxiety Right L4-5/L5-S1 herniated disc, lumbago, lumbar radiculopathy    GI/Hepatic negative GI ROS, Neg liver ROS,   Endo/Other  negative endocrine ROS  Renal/GU negative Renal ROS  negative genitourinary   Musculoskeletal negative musculoskeletal ROS (+)   Abdominal   Peds negative pediatric ROS (+)  Hematology negative hematology ROS (+)   Anesthesia Other Findings   Reproductive/Obstetrics negative OB ROS                           Anesthesia Physical Anesthesia Plan  ASA: II  Anesthesia Plan: General   Post-op Pain Management:    Induction: Intravenous  Airway Management Planned: Oral ETT  Additional Equipment:   Intra-op Plan:   Post-operative Plan: Extubation in OR  Informed Consent: I have reviewed the patients History and Physical, chart, labs and discussed the procedure including the risks, benefits and alternatives for the proposed anesthesia with the patient or authorized representative who has indicated his/her understanding and acceptance.   Dental advisory given  Plan Discussed with: CRNA  Anesthesia Plan Comments:         Anesthesia Quick Evaluation

## 2015-01-26 ENCOUNTER — Ambulatory Visit (HOSPITAL_COMMUNITY): Admission: RE | Admit: 2015-01-26 | Payer: PRIVATE HEALTH INSURANCE | Source: Ambulatory Visit | Admitting: Neurosurgery

## 2015-01-26 ENCOUNTER — Inpatient Hospital Stay (HOSPITAL_COMMUNITY): Payer: PRIVATE HEALTH INSURANCE

## 2015-01-26 ENCOUNTER — Observation Stay (HOSPITAL_COMMUNITY): Payer: PRIVATE HEALTH INSURANCE | Admitting: Anesthesiology

## 2015-01-26 ENCOUNTER — Encounter (HOSPITAL_COMMUNITY)
Admission: EM | Disposition: A | Discharge status: Home / Self Care | Payer: Self-pay | Source: Home / Self Care | Attending: Neurosurgery

## 2015-01-26 DIAGNOSIS — F17210 Nicotine dependence, cigarettes, uncomplicated: Secondary | ICD-10-CM | POA: Diagnosis present

## 2015-01-26 DIAGNOSIS — M545 Low back pain: Secondary | ICD-10-CM | POA: Diagnosis present

## 2015-01-26 DIAGNOSIS — M5116 Intervertebral disc disorders with radiculopathy, lumbar region: Secondary | ICD-10-CM | POA: Diagnosis present

## 2015-01-26 DIAGNOSIS — Z79899 Other long term (current) drug therapy: Secondary | ICD-10-CM | POA: Diagnosis not present

## 2015-01-26 DIAGNOSIS — F411 Generalized anxiety disorder: Secondary | ICD-10-CM | POA: Diagnosis present

## 2015-01-26 HISTORY — PX: LUMBAR LAMINECTOMY/DECOMPRESSION MICRODISCECTOMY: SHX5026

## 2015-01-26 LAB — SURGICAL PCR SCREEN
MRSA, PCR: NEGATIVE
Staphylococcus aureus: NEGATIVE

## 2015-01-26 SURGERY — LUMBAR LAMINECTOMY/DECOMPRESSION MICRODISCECTOMY 1 LEVEL
Anesthesia: General | Site: Back | Laterality: Right

## 2015-01-26 MED ORDER — LIDOCAINE HCL (CARDIAC) 20 MG/ML IV SOLN
INTRAVENOUS | Status: AC
  Filled 2015-01-26: qty 5

## 2015-01-26 MED ORDER — NEOSTIGMINE METHYLSULFATE 10 MG/10ML IV SOLN
INTRAVENOUS | Status: DC | PRN
  Administered 2015-01-26: 4 mg via INTRAVENOUS

## 2015-01-26 MED ORDER — PROPOFOL 10 MG/ML IV BOLUS
INTRAVENOUS | Status: DC | PRN
  Administered 2015-01-26: 200 mg via INTRAVENOUS

## 2015-01-26 MED ORDER — ROCURONIUM BROMIDE 50 MG/5ML IV SOLN
INTRAVENOUS | Status: AC
  Filled 2015-01-26: qty 1

## 2015-01-26 MED ORDER — LIDOCAINE HCL (CARDIAC) 20 MG/ML IV SOLN
INTRAVENOUS | Status: DC | PRN
  Administered 2015-01-26: 50 mg via INTRAVENOUS

## 2015-01-26 MED ORDER — HYDROMORPHONE HCL 1 MG/ML IJ SOLN
INTRAMUSCULAR | Status: AC
  Filled 2015-01-26: qty 1

## 2015-01-26 MED ORDER — ONDANSETRON HCL 4 MG/2ML IJ SOLN
INTRAMUSCULAR | Status: AC
  Filled 2015-01-26: qty 2

## 2015-01-26 MED ORDER — FENTANYL CITRATE 0.05 MG/ML IJ SOLN
INTRAMUSCULAR | Status: AC
  Filled 2015-01-26: qty 2

## 2015-01-26 MED ORDER — SODIUM CHLORIDE 0.9 % IR SOLN
Status: DC | PRN
  Administered 2015-01-26: 15:00:00

## 2015-01-26 MED ORDER — THROMBIN 5000 UNITS EX SOLR
CUTANEOUS | Status: DC | PRN
  Administered 2015-01-26 (×2): 5000 [IU] via TOPICAL

## 2015-01-26 MED ORDER — HYDROMORPHONE HCL 1 MG/ML IJ SOLN
INTRAMUSCULAR | Status: AC
  Administered 2015-01-26: 0.5 mg via INTRAVENOUS
  Filled 2015-01-26: qty 1

## 2015-01-26 MED ORDER — ROCURONIUM BROMIDE 100 MG/10ML IV SOLN
INTRAVENOUS | Status: DC | PRN
  Administered 2015-01-26: 50 mg via INTRAVENOUS

## 2015-01-26 MED ORDER — MORPHINE SULFATE (PF) 1 MG/ML IV SOLN
INTRAVENOUS | Status: DC
  Administered 2015-01-26 (×2): via INTRAVENOUS
  Administered 2015-01-26: 16.5 mg via INTRAVENOUS
  Filled 2015-01-26 (×2): qty 25

## 2015-01-26 MED ORDER — MIDAZOLAM HCL 2 MG/2ML IJ SOLN
INTRAMUSCULAR | Status: AC
  Filled 2015-01-26: qty 2

## 2015-01-26 MED ORDER — PROPOFOL 10 MG/ML IV BOLUS
INTRAVENOUS | Status: AC
  Filled 2015-01-26: qty 20

## 2015-01-26 MED ORDER — ACETAMINOPHEN 10 MG/ML IV SOLN
INTRAVENOUS | Status: AC
  Administered 2015-01-26: 1000 mg via INTRAVENOUS
  Filled 2015-01-26: qty 100

## 2015-01-26 MED ORDER — FENTANYL CITRATE 0.05 MG/ML IJ SOLN
INTRAMUSCULAR | Status: DC | PRN
  Administered 2015-01-26: 100 ug via INTRAVENOUS

## 2015-01-26 MED ORDER — PHENYLEPHRINE HCL 10 MG/ML IJ SOLN
INTRAMUSCULAR | Status: AC
  Filled 2015-01-26: qty 1

## 2015-01-26 MED ORDER — GLYCOPYRROLATE 0.2 MG/ML IJ SOLN
INTRAMUSCULAR | Status: DC | PRN
  Administered 2015-01-26: .7 mg via INTRAVENOUS

## 2015-01-26 MED ORDER — FENTANYL CITRATE 0.05 MG/ML IJ SOLN
INTRAMUSCULAR | Status: DC | PRN
  Administered 2015-01-26: 150 ug via INTRAVENOUS

## 2015-01-26 MED ORDER — 0.9 % SODIUM CHLORIDE (POUR BTL) OPTIME
TOPICAL | Status: DC | PRN
  Administered 2015-01-26: 1000 mL

## 2015-01-26 MED ORDER — METHYLPREDNISOLONE ACETATE 80 MG/ML IJ SUSP
INTRAMUSCULAR | Status: DC | PRN
  Administered 2015-01-26: 80 mg

## 2015-01-26 MED ORDER — ONDANSETRON HCL 4 MG/2ML IJ SOLN: 4.0000 mg | Freq: Once | INTRAMUSCULAR | Status: DC | PRN

## 2015-01-26 MED ORDER — THROMBIN 5000 UNITS EX SOLR
OROMUCOSAL | Status: DC | PRN
  Administered 2015-01-26: 15:00:00 via TOPICAL

## 2015-01-26 MED ORDER — CEFAZOLIN SODIUM-DEXTROSE 2-3 GM-% IV SOLR
INTRAVENOUS | Status: DC | PRN
  Administered 2015-01-26: 2 g via INTRAVENOUS

## 2015-01-26 MED ORDER — HYDROMORPHONE HCL 1 MG/ML IJ SOLN
0.2500 mg | INTRAMUSCULAR | Status: DC | PRN
  Administered 2015-01-26 (×4): 0.5 mg via INTRAVENOUS

## 2015-01-26 MED ORDER — OXYCODONE HCL 15 MG PO TABS: 15.0000 mg | ORAL_TABLET | ORAL | Status: DC | PRN

## 2015-01-26 MED ORDER — PHENYLEPHRINE 40 MCG/ML (10ML) SYRINGE FOR IV PUSH (FOR BLOOD PRESSURE SUPPORT)
PREFILLED_SYRINGE | INTRAVENOUS | Status: AC
  Filled 2015-01-26: qty 10

## 2015-01-26 MED ORDER — LACTATED RINGERS IV SOLN
INTRAVENOUS | Status: DC | PRN
  Administered 2015-01-26 (×2): via INTRAVENOUS

## 2015-01-26 MED ORDER — MIDAZOLAM HCL 5 MG/5ML IJ SOLN
INTRAMUSCULAR | Status: DC | PRN
  Administered 2015-01-26: 2 mg via INTRAVENOUS

## 2015-01-26 MED ORDER — EPHEDRINE SULFATE 50 MG/ML IJ SOLN
INTRAMUSCULAR | Status: AC
  Filled 2015-01-26: qty 1

## 2015-01-26 MED ORDER — OXYCODONE HCL 5 MG PO TABS
ORAL_TABLET | ORAL | Status: AC
  Filled 2015-01-26: qty 3

## 2015-01-26 MED ORDER — NEOSTIGMINE METHYLSULFATE 10 MG/10ML IV SOLN
INTRAVENOUS | Status: AC
  Filled 2015-01-26: qty 1

## 2015-01-26 MED ORDER — GLYCOPYRROLATE 0.2 MG/ML IJ SOLN
INTRAMUSCULAR | Status: AC
  Filled 2015-01-26: qty 3

## 2015-01-26 MED ORDER — KETAMINE HCL 100 MG/ML IJ SOLN
INTRAMUSCULAR | Status: AC
  Administered 2015-01-26: 50 mg via INTRAVENOUS
  Filled 2015-01-26: qty 1

## 2015-01-26 MED ORDER — ALUM & MAG HYDROXIDE-SIMETH 200-200-20 MG/5ML PO SUSP: 30.0000 mL | Freq: Four times a day (QID) | ORAL | Status: DC | PRN

## 2015-01-26 MED ORDER — FENTANYL CITRATE 0.05 MG/ML IJ SOLN: 25.0000 ug | INTRAMUSCULAR | Status: DC | PRN

## 2015-01-26 MED ORDER — ONDANSETRON HCL 4 MG/2ML IJ SOLN
INTRAMUSCULAR | Status: DC | PRN
  Administered 2015-01-26: 4 mg via INTRAVENOUS

## 2015-01-26 MED ORDER — OXYCODONE-ACETAMINOPHEN 10-325 MG PO TABS: 1.0000 | ORAL_TABLET | ORAL | Status: DC | PRN

## 2015-01-26 MED ORDER — FENTANYL CITRATE 0.05 MG/ML IJ SOLN
INTRAMUSCULAR | Status: AC
  Filled 2015-01-26: qty 5

## 2015-01-26 MED ORDER — HEMOSTATIC AGENTS (NO CHARGE) OPTIME
TOPICAL | Status: DC | PRN
  Administered 2015-01-26: 1 via TOPICAL

## 2015-01-26 SURGICAL SUPPLY — 61 items
BAG DECANTER FOR FLEXI CONT (MISCELLANEOUS) ×3 IMPLANT
BENZOIN TINCTURE PRP APPL 2/3 (GAUZE/BANDAGES/DRESSINGS) IMPLANT
BLADE CLIPPER SURG (BLADE) ×3 IMPLANT
BLADE SURG 11 STRL SS (BLADE) ×3 IMPLANT
BUR MATCHSTICK NEURO 3.0 LAGG (BURR) ×3 IMPLANT
CANISTER SUCT 3000ML PPV (MISCELLANEOUS) ×3 IMPLANT
CLOSURE WOUND 1/2 X4 (GAUZE/BANDAGES/DRESSINGS)
CONT SPEC 4OZ CLIKSEAL STRL BL (MISCELLANEOUS) ×3 IMPLANT
DECANTER SPIKE VIAL GLASS SM (MISCELLANEOUS) ×3 IMPLANT
DRAPE LAPAROTOMY 100X72X124 (DRAPES) ×3 IMPLANT
DRAPE MICROSCOPE LEICA (MISCELLANEOUS) ×3 IMPLANT
DRAPE POUCH INSTRU U-SHP 10X18 (DRAPES) ×3 IMPLANT
DRAPE SURG 17X23 STRL (DRAPES) ×3 IMPLANT
DRSG OPSITE POSTOP 3X4 (GAUZE/BANDAGES/DRESSINGS) ×3 IMPLANT
DURAPREP 26ML APPLICATOR (WOUND CARE) ×6 IMPLANT
ELECT REM PT RETURN 9FT ADLT (ELECTROSURGICAL) ×3
ELECTRODE REM PT RTRN 9FT ADLT (ELECTROSURGICAL) ×1 IMPLANT
GAUZE SPONGE 4X4 12PLY STRL (GAUZE/BANDAGES/DRESSINGS) IMPLANT
GAUZE SPONGE 4X4 16PLY XRAY LF (GAUZE/BANDAGES/DRESSINGS) IMPLANT
GLOVE BIO SURGEON STRL SZ8 (GLOVE) ×3 IMPLANT
GLOVE BIOGEL PI IND STRL 7.5 (GLOVE) ×1 IMPLANT
GLOVE BIOGEL PI IND STRL 8.5 (GLOVE) ×1 IMPLANT
GLOVE BIOGEL PI INDICATOR 7.5 (GLOVE) ×2
GLOVE BIOGEL PI INDICATOR 8.5 (GLOVE) ×2
GLOVE ECLIPSE 7.0 STRL STRAW (GLOVE) ×3 IMPLANT
GLOVE EXAM NITRILE LRG STRL (GLOVE) IMPLANT
GLOVE EXAM NITRILE MD LF STRL (GLOVE) IMPLANT
GLOVE EXAM NITRILE XL STR (GLOVE) IMPLANT
GLOVE EXAM NITRILE XS STR PU (GLOVE) IMPLANT
GLOVE SURG SS PI 7.0 STRL IVOR (GLOVE) ×6 IMPLANT
GOWN STRL REUS W/ TWL LRG LVL3 (GOWN DISPOSABLE) ×1 IMPLANT
GOWN STRL REUS W/ TWL XL LVL3 (GOWN DISPOSABLE) ×2 IMPLANT
GOWN STRL REUS W/TWL 2XL LVL3 (GOWN DISPOSABLE) IMPLANT
GOWN STRL REUS W/TWL LRG LVL3 (GOWN DISPOSABLE) ×2
GOWN STRL REUS W/TWL XL LVL3 (GOWN DISPOSABLE) ×4
HEMOSTAT POWDER KIT SURGIFOAM (HEMOSTASIS) ×3 IMPLANT
KIT BASIN OR (CUSTOM PROCEDURE TRAY) ×3 IMPLANT
KIT ROOM TURNOVER OR (KITS) ×3 IMPLANT
LIDOCAINE 1% W/EPI 1:100,000 IMPLANT
LIQUID BAND (GAUZE/BANDAGES/DRESSINGS) ×3 IMPLANT
NEEDLE HYPO 18GX1.5 BLUNT FILL (NEEDLE) ×3 IMPLANT
NEEDLE HYPO 25X1 1.5 SAFETY (NEEDLE) ×3 IMPLANT
NEEDLE SPNL 18GX3.5 QUINCKE PK (NEEDLE) ×3 IMPLANT
NS IRRIG 1000ML POUR BTL (IV SOLUTION) ×3 IMPLANT
PACK LAMINECTOMY NEURO (CUSTOM PROCEDURE TRAY) ×3 IMPLANT
PAD ARMBOARD 7.5X6 YLW CONV (MISCELLANEOUS) ×9 IMPLANT
RUBBERBAND STERILE (MISCELLANEOUS) ×6 IMPLANT
SENSORCAINE 0.5% IMPLANT
SPONGE LAP 4X18 X RAY DECT (DISPOSABLE) IMPLANT
SPONGE SURGIFOAM ABS GEL SZ50 (HEMOSTASIS) ×3 IMPLANT
STRIP CLOSURE SKIN 1/2X4 (GAUZE/BANDAGES/DRESSINGS) IMPLANT
SUT VIC AB 0 CT1 18XCR BRD8 (SUTURE) ×1 IMPLANT
SUT VIC AB 0 CT1 8-18 (SUTURE) ×2
SUT VIC AB 2-0 CT1 18 (SUTURE) IMPLANT
SUT VICRYL 3-0 RB1 18 ABS (SUTURE) ×3 IMPLANT
SYR 20ML ECCENTRIC (SYRINGE) ×3 IMPLANT
SYR 3ML LL SCALE MARK (SYRINGE) ×3 IMPLANT
SYR 5ML LL (SYRINGE) ×3 IMPLANT
TOWEL OR 17X24 6PK STRL BLUE (TOWEL DISPOSABLE) ×3 IMPLANT
TOWEL OR 17X26 10 PK STRL BLUE (TOWEL DISPOSABLE) ×3 IMPLANT
WATER STERILE IRR 1000ML POUR (IV SOLUTION) ×3 IMPLANT

## 2015-01-26 NOTE — Progress Notes (Signed)
Pt is discharged home. Discharge instructions were given to patient and family 

## 2015-01-26 NOTE — Progress Notes (Signed)
No issues overnight. Pt continues to report severe back and primarily right leg pain running down the back of his leg.  EXAM:  BP 103/66 mmHg  Pulse 61  Temp(Src) 97.5 F (36.4 C) (Oral)  Resp 15  SpO2 100%  Awake, alert, oriented  Speech fluent, appropriate  CN grossly intact  5/5 BUE/BLE   IMPRESSION:  49 y.o. 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 Risks and benefits were again reviewed, and are essentially the same as the prior surgery. All his questions were answered and consent was obtained.

## 2015-01-26 NOTE — Progress Notes (Signed)
UR completed 

## 2015-01-26 NOTE — Progress Notes (Signed)
Pt was readmitted to 4N22 from PACU

## 2015-01-26 NOTE — Discharge Summary (Signed)
  Physician Discharge Summary  Patient ID: Miguel Jimenez MRN: [id number] DOB/AGE: 48/08/1966 49 y.o.  Admit date: 01/25/2015 Discharge date: 01/26/2015  Admission Diagnoses: Recurrent lumbar disc herniation with radiculopathy, L5-S1  Discharge Diagnoses: Same Active Problems:   Lumbar herniated disc   Discharged Condition: Stable  Hospital Course:  Mrs. Miguel Jimenez is a 49 y.o. male who presented to the clinic after recent extraforaminal L5-S1 discectomy, with new radiculopathy and MRI demonstrating recurrent right eccentric L5-S1 disc herniation. The patient was admitted after presenting to the ED for pain control and underwent right L5-S1 laminotomy and microdiscectomy which was done without complication. Postoperatively, the patient was at neurologic baseline. Back pain was controlled with oral medication, he was ambulating without difficulty, voiding normally, and tolerating diet.  Treatments: Surgery - Right L5-S1 laminotomy, microdiscectomy  Discharge Exam: Blood pressure 116/92, pulse 60, temperature 98.7 F (37.1 C), temperature source Oral, resp. rate 14, SpO2 97 %. Awake, alert, oriented Speech fluent, appropriate CN grossly intact 5/5 BUE/BLE Wound c/d/i  Follow-up: Follow-up in my office [facility] [phone]) in 2-3 weeks  Disposition: 07-Left Against Medical Advice     Medication List    STOP taking these medications        oxyCODONE-acetaminophen 7.5-325 MG per tablet  Commonly known as:  PERCOCET  Replaced by:  oxyCODONE-acetaminophen 10-325 MG per tablet      TAKE these medications        MELATONIN PO  Take 1 capsule by mouth at bedtime as needed (for sleep).     oxyCODONE 15 MG immediate release tablet  Commonly known as:  ROXICODONE  Take 1 tablet (15 mg total) by mouth every 4 (four) hours as needed for moderate pain.     oxyCODONE-acetaminophen 10-325 MG per tablet  Commonly known as:  PERCOCET  Take 1 tablet by mouth  every 4 (four) hours as needed for pain.         SignedJackelyn Hoehn: Demarrius Guerrero, C 01/26/2015, 4:37 PM

## 2015-01-26 NOTE — Transfer of Care (Signed)
Immediate Anesthesia Transfer of Care Note  Patient: Miguel Jimenez  Procedure(s) Performed: Procedure(s) with comments: Right Lumbar five, Sacral one diskectomy (Right) - Right Lumbar five, Sacral one diskectomy  Patient Location: PACU  Anesthesia Type:General  Level of Consciousness: awake, alert  and oriented  Airway & Oxygen Therapy: Patient Spontanous Breathing and Patient connected to nasal cannula oxygen  Post-op Assessment: Report given to RN and Post -op Vital signs reviewed and stable  Post vital signs: Reviewed and stable  Last Vitals:  Filed Vitals:   01/26/15 1200  BP:   Pulse:   Temp:   Resp: 15    Complications: No apparent anesthesia complications

## 2015-01-26 NOTE — Anesthesia Procedure Notes (Signed)
Procedure Name: Intubation Date/Time: 01/26/2015 1:39 PM Performed by: ADAMI, RICHARD Pre-anesthesia Checklist: Patient identified, Timeout performed, Emergency Drugs available, Suction available and Patient being monitored Patient Re-evaluated:Patient Re-evaluated prior to inductionOxygen Delivery Method: Circle system utilized Preoxygenation: Pre-oxygenation with 100% oxygen Intubation Type: IV induction Ventilation: Mask ventilation without difficulty Laryngoscope Size: Mac and 4 Grade View: Grade I Tube size: 7.5 mm Airway Equipment and Method: Stylet and LTA kit utilized Placement Confirmation: ETT inserted through vocal cords under direct vision,  breath sounds checked- equal and bilateral and positive ETCO2 Secured at: 22 cm Tube secured with: Tape Dental Injury: Teeth and Oropharynx as per pre-operative assessment      

## 2015-01-26 NOTE — Progress Notes (Signed)
Patient complained of continuous pain even after giving pain medication. Dr. Lovell SheehanJenkins paged. MD informed RN that patient was supposed to start PCA during day shift, PCA never started. PCA reordered. Now awaiting pharmacy verification.  Monia PouchShakenna Vernita Tague, RN

## 2015-01-26 NOTE — Addendum Note (Signed)
Addendum  created 01/26/15 1638 by Gwenyth Allegraichard Savaya Hakes, CRNA   Modules edited: Anesthesia Events, Narrator   Narrator:  Narrator: Event Log Edited

## 2015-01-26 NOTE — Anesthesia Postprocedure Evaluation (Signed)
  Anesthesia Post-op Note  Patient: Miguel Jimenez  Procedure(s) Performed: Procedure(s) (LRB): Right Lumbar five, Sacral one diskectomy (Right)  Patient Location: PACU  Anesthesia Type: General  Level of Consciousness: awake and alert   Airway and Oxygen Therapy: Patient Spontanous Breathing  Post-op Pain: mild  Post-op Assessment: Post-op Vital signs reviewed, Patient's Cardiovascular Status Stable, Respiratory Function Stable, Patent Airway and No signs of Nausea or vomiting  Last Vitals:  Filed Vitals:   01/26/15 1525  BP:   Pulse:   Temp: 37.1 C  Resp:     Post-op Vital Signs: stable   Complications: No apparent anesthesia complications

## 2015-01-26 NOTE — Progress Notes (Signed)
Patient would like to speak with Dr. Conchita ParisNundkumar in person before signing the consent for surgery. Patient states he does not know much about his surgery. Wife cosigns. Monia PouchShakenna Jamariah Tony, RN

## 2015-01-26 NOTE — Progress Notes (Signed)
RN called to patients room. Patient hysterically crying in pain and wife at bedside upset as well. Patient states Morphine PCA isn't working at all. O2 at 97 on 2L, RR at 17. RN gave patient PRN dose of Oxy IR 15 mg. PCA button removed from patient, RN explained the risk of oversedation to patient and his wife. Patient CO2 and O2 levels still being monitored by pump. Will continue to monitor patient closely. Monia PouchShakenna Kianni Lheureux, RN

## 2015-01-26 NOTE — Op Note (Signed)
PREOP DIAGNOSIS: Recurrent lumbar disc herniation, L5-S1  POSTOP DIAGNOSIS: Same  PROCEDURE: 1. Right L5-S1 laminotomy and microdiscectomy for decompression of nerve root 2. Use of operating microscope  SURGEON: Dr. Lisbeth RenshawNeelesh Savalas Monje, MD  ASSISTANT: Dr. Maeola HarmanJoseph Stern, MD  ANESTHESIA: General Endotracheal  EBL: 100cc  SPECIMENS: None  DRAINS: None  COMPLICATIONS: None immediate  CONDITION: Hemodynamically stable to PACU  HISTORY: Natale MilchKirk Urschel is a 49 y.o. male who initially underwent extraforaminal right L5-S1 microdiscectomy 3 weeks ago. He did very well for about a week and then had sudden onset of severe back and right leg pain. Repeat MRI demonstrate disc herniation within the canal causing right S1 root compression. Treatment options were discussed, and he elected to proceed with repeat microdiscectomy.   PROCEDURE IN DETAIL: After informed consent was obtained and witnessed, the patient was brought to the operating room. After induction of general anesthesia, the patient was positioned on the operative table in the prone position with all pressure points meticulously padded. The skin of the low back was then prepped and draped in the usual sterile fashion.  After timeout was conducted, the previous skin incision was then opened sharply and Bovie electrocautery was used to dissect the subcutaneous tissue until the lumbodorsal fascia was identified. A small subcutaneous seroma was encountered which was drained. The fascia was then incised using Bovie electrocautery and the lamina at the L5 level was identified and dissection was carried out in the subperiosteal plane. Self-retaining retractor was then placed, and intraoperative x-ray was taken to confirm we were at the correct level.  Using a high-speed drill, laminotomy was completed with a partial medial facetectomy. Approximately 1cm of the pars remained between the curernt laminotomy and the previous lateral facetectomy. The  ligamentum flavum was then identified and removed and the lateral edge of the thecal sac was identified. This was then traced down to identify the traversing nerve root. Dissection was then carried out lateral to the nerve root to identify the disc herniation. Using a combination of dissectors, curettes, and rongeurs, the multiple small, soft herniated disc fragments were removed. The decompression of the nerve root was confirmed using a dissector.  Hemostasis was then secured using a combination of morcellized Gelfoam and thrombin and bipolar electrocautery. The wound is irrigated with copious amounts of antibiotic saline irrigation. The nerve root was then covered with a long-acting steroid solution mixed with fentanyl. Self-retaining retractor was then removed, and the wound is closed in layers using a combination of interrupted 0 Vicryl and 3-0 Vicryl stitches. The skin was closed using standard skin glue.  At the end of the case all sponge, needle, and instrument counts were correct. The patient was then transferred to the stretcher and taken to the postanesthesia care unit in stable hemodynamic condition.

## 2015-01-26 NOTE — Progress Notes (Signed)
Wasted remaining 16mg  of Morphine PCA in sink. Joy, RN witnessed.

## 2015-01-29 ENCOUNTER — Encounter (HOSPITAL_COMMUNITY): Payer: Self-pay | Admitting: Neurosurgery

## 2015-01-31 ENCOUNTER — Ambulatory Visit (INDEPENDENT_AMBULATORY_CARE_PROVIDER_SITE_OTHER): Payer: PRIVATE HEALTH INSURANCE | Admitting: Family Medicine

## 2015-01-31 ENCOUNTER — Encounter: Payer: Self-pay | Admitting: Family Medicine

## 2015-01-31 VITALS — BP 102/70 | HR 80 | Temp 98.2°F | Wt 204.0 lb

## 2015-01-31 DIAGNOSIS — J0100 Acute maxillary sinusitis, unspecified: Secondary | ICD-10-CM

## 2015-01-31 MED ORDER — AMOXICILLIN-POT CLAVULANATE 875-125 MG PO TABS: 1.0000 | ORAL_TABLET | Freq: Two times a day (BID) | ORAL | Status: AC

## 2015-01-31 MED ORDER — ALBUTEROL SULFATE HFA 108 (90 BASE) MCG/ACT IN AERS: 1.0000 | INHALATION_SPRAY | Freq: Four times a day (QID) | RESPIRATORY_TRACT | Status: DC | PRN

## 2015-01-31 MED ORDER — HYDROCODONE-HOMATROPINE 5-1.5 MG/5ML PO SYRP: 5.0000 mL | ORAL_SOLUTION | Freq: Every evening | ORAL | Status: DC | PRN

## 2015-01-31 NOTE — Progress Notes (Signed)
Pre visit review using our clinic review tool, if applicable. No additional management support is needed unless otherwise documented below in the visit note.  Sx started about 2-3 days ago.  Cough, back was aching.  Sinus drainage, more than usual.  More HA, esp laying down.  Coughing all night.  No fevers now, but had one a few days ago.  Some sputum, discolored.  Ear pressure B but he has trouble noting a change in baseline after orbital changes prev.  Diarrhea resolved, some nausea.  No rash.    Meds, vitals, and allergies reviewed.   ROS: See HPI.  Otherwise, noncontributory.  nad ncat Tm wnl OP wnl, mmm Nasal exam stuffy Sinuses ttp x4, even on the side (R) prev unaffected from trauma (see PMH) Neck supple, no LA rrr L >R wheeze and rhonchi (faint).  No UAN.  occ cough.  No inc in wob abd soft Ext well perfused.

## 2015-01-31 NOTE — Patient Instructions (Signed)
Start augmentin today, use the inhaler if needed, and take the cough syrup at night.  Try to get some rest and drink plenty of fluids.  Glad to see you.

## 2015-02-01 DIAGNOSIS — J0100 Acute maxillary sinusitis, unspecified: Secondary | ICD-10-CM | POA: Insufficient documentation

## 2015-02-01 NOTE — Assessment & Plan Note (Signed)
Given his exam and hx, would treat.  Start augmentin, use SABA and hycodan prn for cough. He is working to quit smoking.  Still okay for outpatient f/u.  He agrees with plan.

## 2015-02-07 ENCOUNTER — Encounter: Payer: Self-pay | Admitting: Family Medicine

## 2015-02-07 ENCOUNTER — Ambulatory Visit (INDEPENDENT_AMBULATORY_CARE_PROVIDER_SITE_OTHER): Payer: PRIVATE HEALTH INSURANCE | Admitting: Family Medicine

## 2015-02-07 VITALS — BP 110/80 | HR 79 | Temp 97.8°F | Ht 71.5 in | Wt 206.0 lb

## 2015-02-07 DIAGNOSIS — G4459 Other complicated headache syndrome: Secondary | ICD-10-CM

## 2015-02-07 DIAGNOSIS — R41840 Attention and concentration deficit: Secondary | ICD-10-CM

## 2015-02-07 DIAGNOSIS — F0781 Postconcussional syndrome: Secondary | ICD-10-CM

## 2015-02-07 DIAGNOSIS — R413 Other amnesia: Secondary | ICD-10-CM

## 2015-02-07 MED ORDER — AMITRIPTYLINE HCL 25 MG PO TABS: 25.0000 mg | ORAL_TABLET | Freq: Every day | ORAL | Status: AC
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
## 2015-02-07 NOTE — Progress Notes (Signed)
Pre visit review using our clinic review tool, if applicable. No additional management support is needed unless otherwise documented below in the visit note. 

## 2015-02-07 NOTE — Patient Instructions (Signed)

## 2015-02-12 ENCOUNTER — Ambulatory Visit
Admission: RE | Admit: 2015-02-12 | Discharge: 2015-02-12 | Disposition: A | Discharge status: Home / Self Care | Payer: PRIVATE HEALTH INSURANCE | Source: Ambulatory Visit | Attending: Family Medicine | Admitting: Family Medicine

## 2015-02-12 DIAGNOSIS — F0781 Postconcussional syndrome: Secondary | ICD-10-CM

## 2015-02-12 DIAGNOSIS — R413 Other amnesia: Secondary | ICD-10-CM

## 2015-02-12 DIAGNOSIS — R41840 Attention and concentration deficit: Secondary | ICD-10-CM

## 2015-02-12 DIAGNOSIS — G4459 Other complicated headache syndrome: Secondary | ICD-10-CM

## 2015-02-16 ENCOUNTER — Ambulatory Visit: Payer: PRIVATE HEALTH INSURANCE | Admitting: Neurology

## 2015-02-19 ENCOUNTER — Encounter: Payer: Self-pay | Admitting: Neurology

## 2015-02-19 ENCOUNTER — Telehealth: Payer: Self-pay | Admitting: Family Medicine

## 2015-02-19 ENCOUNTER — Telehealth: Payer: Self-pay | Admitting: Neurology

## 2015-02-19 NOTE — Telephone Encounter (Signed)
NP appt w/ Dr. Everlena CooperJaffe for 2015/01/02 was no showed. No show letter + policy mailed to pt. Dr. Cyndie Chimeopland's office notified of the no show via EPIC referral note / Sherri S

## 2015-02-19 NOTE — Telephone Encounter (Signed)
Kena Mr Wellman's spouse called to let you know that Mr Quintella BatonBuckner passed away fridayAug 08, 2016
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
## 2015-03-02 DEATH — deceased

## 2015-12-04 IMAGING — CR DG LUMBAR SPINE 2-3V
1 series · 3 of 3 positions shown · non-contrast
Comparison: None.

CLINICAL DATA: Pt c/o lower back pain x several weeks. Reports
injuring back while moving. Pain has continued to increase and now
radiates down right leg.

EXAM:
LUMBAR SPINE - 2-3 VIEW

[Series 1: t lumbar spine ap · 0.14mm/px · 3 of 3 slices shown]
[im 1/3]
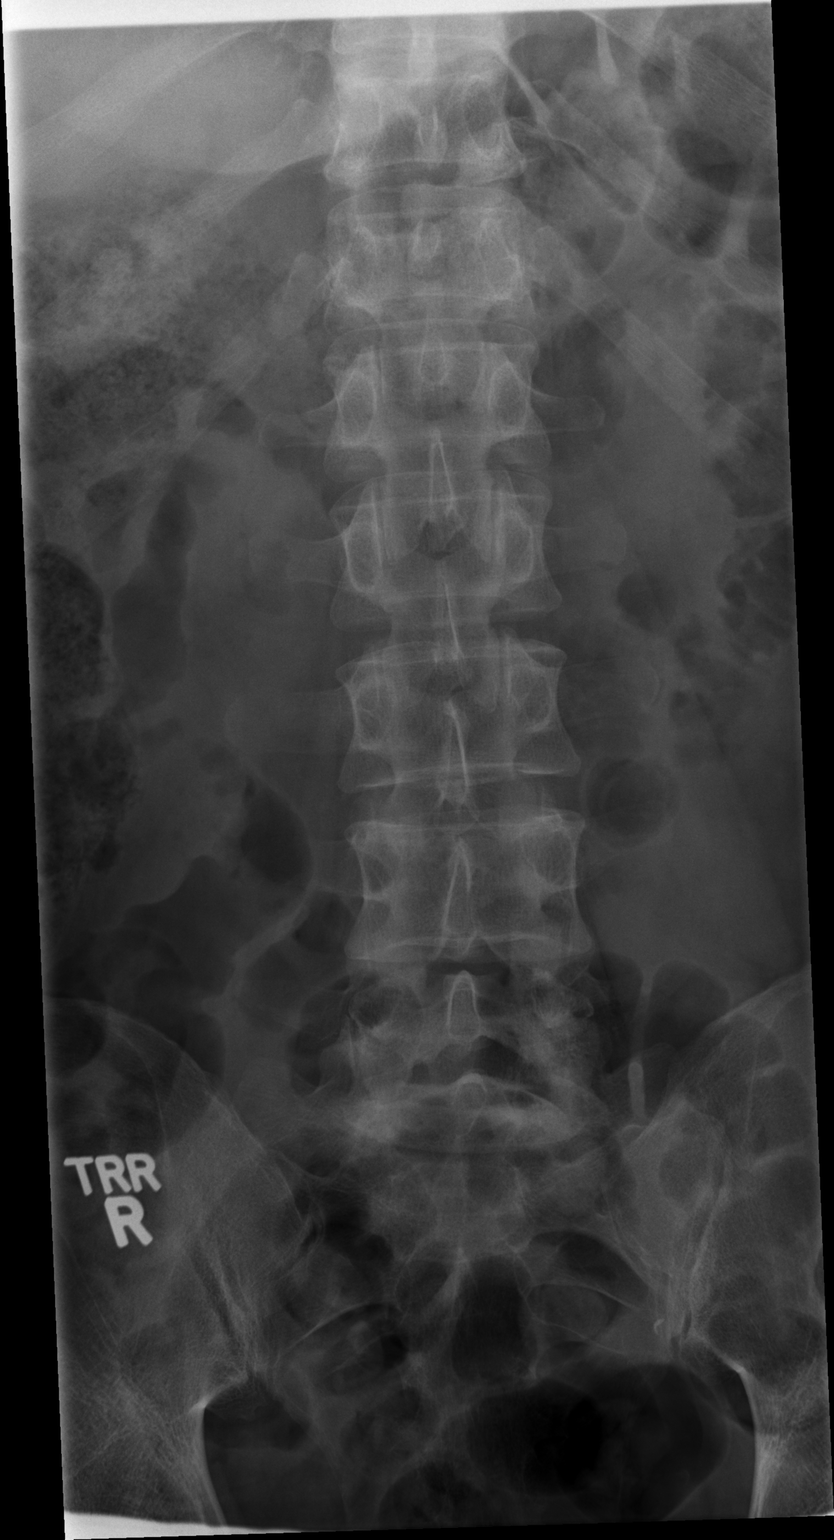
[im 2/3]
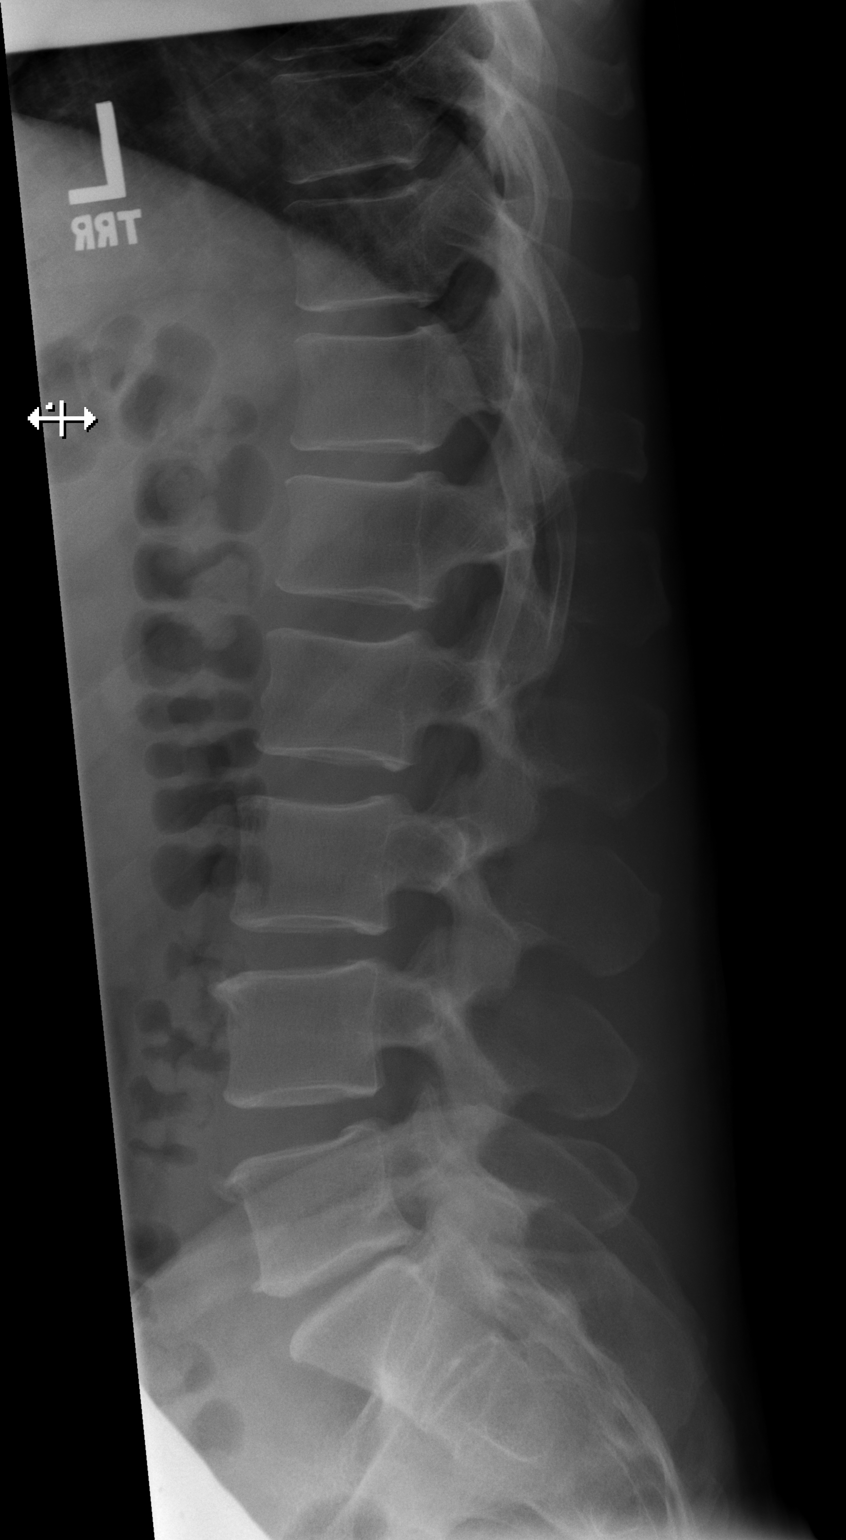
[im 3/3]
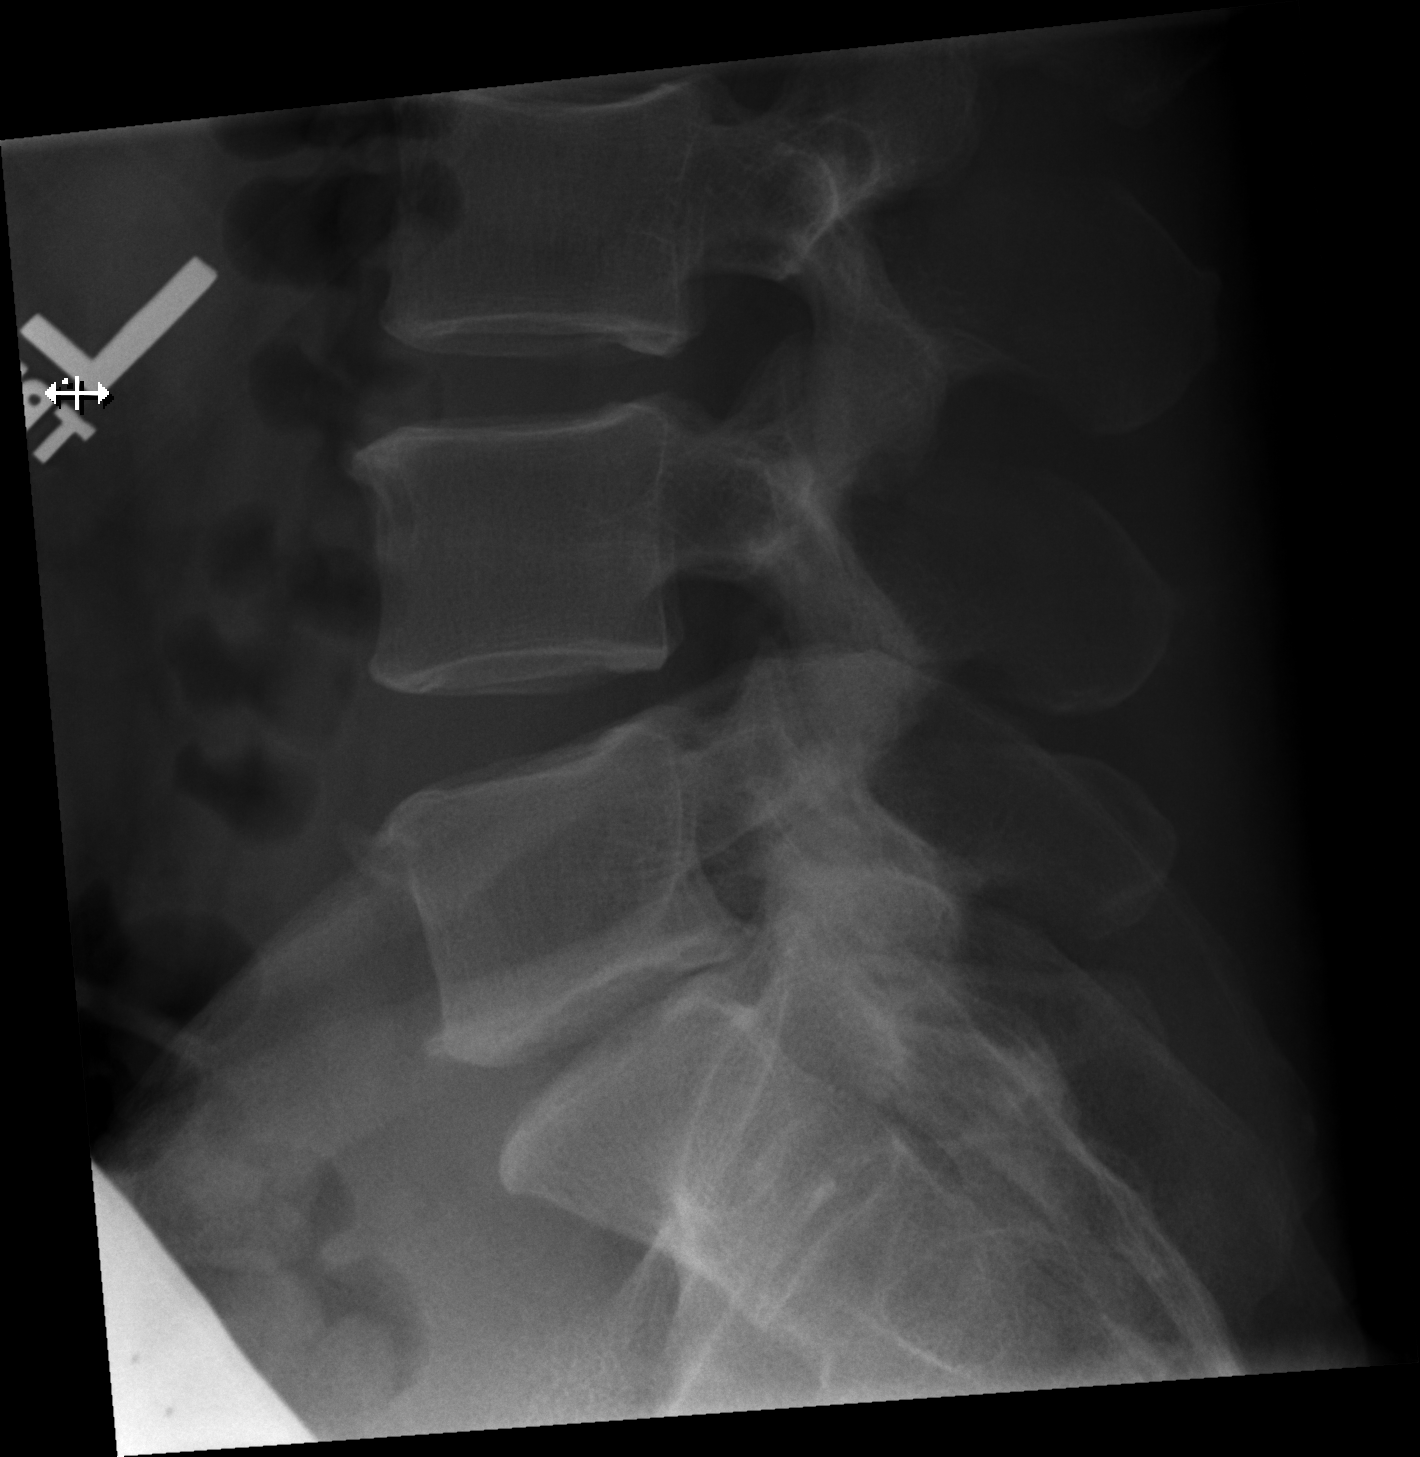

[3 of 3 positions shown; findings below may reference images not displayed]

FINDINGS: There is no evidence of lumbar spine fracture. Alignment is normal.
Disc space narrowing is appreciated at the L5-S1 level as well as
mild grade 1 retrolisthesis. Endplate sclerosis and peripheral
endplate hypertrophic spurring is identified at this level. Facet
sclerosis appreciated at the L5-S1 level.
IMPRESSION: Degenerative disc disease changes at L5-S1 without evidence of acute
osseous abnormalities.
